# Patient Record
Sex: Female | Born: 1990
Health system: Southern US, Community
[De-identification: ages and names within clinical notes are randomized; demographics above are authoritative.]

## PROBLEM LIST (undated history)

## (undated) ENCOUNTER — Inpatient Hospital Stay: Payer: Self-pay

## (undated) DIAGNOSIS — O0930 Supervision of pregnancy with insufficient antenatal care, unspecified trimester: Secondary | ICD-10-CM

## (undated) DIAGNOSIS — O2340 Unspecified infection of urinary tract in pregnancy, unspecified trimester: Secondary | ICD-10-CM

## (undated) DIAGNOSIS — F988 Other specified behavioral and emotional disorders with onset usually occurring in childhood and adolescence: Secondary | ICD-10-CM

## (undated) DIAGNOSIS — Z6834 Body mass index (BMI) 34.0-34.9, adult: Secondary | ICD-10-CM

## (undated) HISTORY — DX: Other specified behavioral and emotional disorders with onset usually occurring in childhood and adolescence: F98.8

## (undated) HISTORY — DX: Unspecified infection of urinary tract in pregnancy, unspecified trimester: O23.40

## (undated) HISTORY — DX: Body mass index (BMI) 34.0-34.9, adult: Z68.34

## (undated) HISTORY — DX: Supervision of pregnancy with insufficient antenatal care, unspecified trimester: O09.30

## (undated) SURGERY — Surgical Case
Anesthesia: Epidural

---
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
## 2015-04-07 DIAGNOSIS — F900 Attention-deficit hyperactivity disorder, predominantly inattentive type: Secondary | ICD-10-CM | POA: Insufficient documentation

## 2015-04-07 DIAGNOSIS — F329 Major depressive disorder, single episode, unspecified: Secondary | ICD-10-CM | POA: Insufficient documentation

## 2015-04-07 DIAGNOSIS — F32A Depression, unspecified: Secondary | ICD-10-CM | POA: Insufficient documentation

## 2015-08-09 DIAGNOSIS — R875 Abnormal microbiological findings in specimens from female genital organs: Secondary | ICD-10-CM | POA: Diagnosis not present

## 2015-08-09 DIAGNOSIS — N920 Excessive and frequent menstruation with regular cycle: Secondary | ICD-10-CM | POA: Diagnosis not present

## 2015-08-09 DIAGNOSIS — Z1151 Encounter for screening for human papillomavirus (HPV): Secondary | ICD-10-CM | POA: Diagnosis not present

## 2015-08-09 DIAGNOSIS — Z309 Encounter for contraceptive management, unspecified: Secondary | ICD-10-CM | POA: Diagnosis not present

## 2015-08-09 DIAGNOSIS — Z113 Encounter for screening for infections with a predominantly sexual mode of transmission: Secondary | ICD-10-CM | POA: Diagnosis not present

## 2015-08-09 DIAGNOSIS — R87610 Atypical squamous cells of undetermined significance on cytologic smear of cervix (ASC-US): Secondary | ICD-10-CM | POA: Diagnosis not present

## 2015-08-09 LAB — HM PAP SMEAR

## 2015-10-05 DIAGNOSIS — Z3201 Encounter for pregnancy test, result positive: Secondary | ICD-10-CM | POA: Diagnosis not present

## 2015-10-05 DIAGNOSIS — Z3A01 Less than 8 weeks gestation of pregnancy: Secondary | ICD-10-CM | POA: Diagnosis not present

## 2015-10-05 DIAGNOSIS — O0991 Supervision of high risk pregnancy, unspecified, first trimester: Secondary | ICD-10-CM | POA: Diagnosis not present

## 2015-10-05 DIAGNOSIS — Z113 Encounter for screening for infections with a predominantly sexual mode of transmission: Secondary | ICD-10-CM | POA: Diagnosis not present

## 2015-10-05 DIAGNOSIS — N912 Amenorrhea, unspecified: Secondary | ICD-10-CM | POA: Diagnosis not present

## 2016-05-10 DIAGNOSIS — B9689 Other specified bacterial agents as the cause of diseases classified elsewhere: Secondary | ICD-10-CM | POA: Diagnosis not present

## 2016-05-10 DIAGNOSIS — N760 Acute vaginitis: Secondary | ICD-10-CM | POA: Diagnosis not present

## 2016-05-10 DIAGNOSIS — N3000 Acute cystitis without hematuria: Secondary | ICD-10-CM | POA: Diagnosis not present

## 2016-08-06 NOTE — L&D Delivery Note (Signed)
EFM D/C'ed for pt to ambulate in hallways as requested.

## 2016-08-06 NOTE — L&D Delivery Note (Signed)
Pt up to shower as okay with Dr. Jean RosenthalJackson

## 2016-08-06 NOTE — L&D Delivery Note (Signed)
Dr.Jackson present to evaluate pt and discuss plan of care with pt at this time pt agrees with plan of care.

## 2016-08-06 NOTE — L&D Delivery Note (Signed)
Dr. Jean RosenthalJackson notified of pt SVE new orders given at this time. May start IV of LR and Stadol 1 mg IV times 1 dose

## 2016-08-06 NOTE — L&D Delivery Note (Signed)
Dr. Jean RosenthalJackson notified of pt stating that her pain has increased and would like something for pain.  Order given for SVE.  Dr. Jean RosenthalJackson is on his way

## 2016-08-06 NOTE — L&D Delivery Note (Signed)
Pt in bed talking with visitors at bedside.  No needs voiced at this time.  Pt requested to eat regular food explained to pt about diet.  Ice chips given

## 2016-08-06 NOTE — L&D Delivery Note (Signed)
Dr. Jean RosenthalJackson notified of pt SVE and request for pain meds.  Dr. Jean RosenthalJackson stated that pt my have epidural at this time.  Will start bolus.

## 2016-08-06 NOTE — L&D Delivery Note (Signed)
EFM reapplied at this time. Will continue to monitor

## 2016-08-30 DIAGNOSIS — O2340 Unspecified infection of urinary tract in pregnancy, unspecified trimester: Secondary | ICD-10-CM

## 2016-08-30 HISTORY — DX: Unspecified infection of urinary tract in pregnancy, unspecified trimester: O23.40

## 2016-08-30 LAB — HM PAP SMEAR

## 2016-08-30 LAB — OB RESULTS CONSOLE GC/CHLAMYDIA
Chlamydia: NEGATIVE
GC PROBE AMP, GENITAL: NEGATIVE

## 2016-08-30 LAB — OB RESULTS CONSOLE VARICELLA ZOSTER ANTIBODY, IGG: Varicella: IMMUNE

## 2016-08-30 LAB — OB RESULTS CONSOLE HGB/HCT, BLOOD
HEMATOCRIT: 34 %
Hemoglobin: 11.3 g/dL

## 2016-08-30 LAB — OB RESULTS CONSOLE RPR: RPR: NONREACTIVE

## 2016-08-30 LAB — OB RESULTS CONSOLE RUBELLA ANTIBODY, IGM: Rubella: IMMUNE

## 2016-08-30 LAB — OB RESULTS CONSOLE ABO/RH: RH TYPE: POSITIVE

## 2016-08-30 LAB — OB RESULTS CONSOLE HEPATITIS B SURFACE ANTIGEN: HEP B S AG: NEGATIVE

## 2016-08-30 LAB — OB RESULTS CONSOLE PLATELET COUNT: PLATELETS: 178 10*3/uL

## 2016-08-30 LAB — OB RESULTS CONSOLE HIV ANTIBODY (ROUTINE TESTING): HIV: NONREACTIVE

## 2016-10-12 LAB — HPV APTIMA
HPV Aptima: NEGATIVE
HPV Aptima: NEGATIVE

## 2016-10-16 ENCOUNTER — Other Ambulatory Visit: Payer: Medicaid Other

## 2016-10-16 ENCOUNTER — Ambulatory Visit (INDEPENDENT_AMBULATORY_CARE_PROVIDER_SITE_OTHER): Payer: Medicaid Other | Admitting: Advanced Practice Midwife

## 2016-10-16 VITALS — BP 110/60 | Wt 189.0 lb

## 2016-10-16 DIAGNOSIS — Z6834 Body mass index (BMI) 34.0-34.9, adult: Secondary | ICD-10-CM

## 2016-10-16 DIAGNOSIS — O99210 Obesity complicating pregnancy, unspecified trimester: Secondary | ICD-10-CM

## 2016-10-16 DIAGNOSIS — Z3A23 23 weeks gestation of pregnancy: Secondary | ICD-10-CM

## 2016-10-16 DIAGNOSIS — Z113 Encounter for screening for infections with a predominantly sexual mode of transmission: Secondary | ICD-10-CM

## 2016-10-16 DIAGNOSIS — Z131 Encounter for screening for diabetes mellitus: Secondary | ICD-10-CM

## 2016-10-16 DIAGNOSIS — O99213 Obesity complicating pregnancy, third trimester: Secondary | ICD-10-CM | POA: Insufficient documentation

## 2016-10-16 DIAGNOSIS — Z369 Encounter for antenatal screening, unspecified: Secondary | ICD-10-CM

## 2016-10-16 NOTE — Progress Notes (Signed)
Doing well. Had not had early glucola yet so having it today. Will need 28 week labs next visit minus the glucola.

## 2016-10-16 NOTE — Progress Notes (Signed)
- 

## 2016-10-17 LAB — GLUCOSE, 1 HOUR GESTATIONAL: Gestational Diabetes Screen: 86 mg/dL (ref 65–139)

## 2016-11-13 ENCOUNTER — Ambulatory Visit (INDEPENDENT_AMBULATORY_CARE_PROVIDER_SITE_OTHER): Payer: Medicaid Other | Admitting: Obstetrics & Gynecology

## 2016-11-13 VITALS — BP 110/60 | Wt 193.0 lb

## 2016-11-13 DIAGNOSIS — Z6834 Body mass index (BMI) 34.0-34.9, adult: Secondary | ICD-10-CM

## 2016-11-13 DIAGNOSIS — O99213 Obesity complicating pregnancy, third trimester: Secondary | ICD-10-CM

## 2016-11-13 DIAGNOSIS — Z3490 Encounter for supervision of normal pregnancy, unspecified, unspecified trimester: Secondary | ICD-10-CM

## 2016-11-13 DIAGNOSIS — O0993 Supervision of high risk pregnancy, unspecified, third trimester: Secondary | ICD-10-CM | POA: Insufficient documentation

## 2016-11-13 DIAGNOSIS — O99210 Obesity complicating pregnancy, unspecified trimester: Secondary | ICD-10-CM

## 2016-11-13 DIAGNOSIS — Z3A27 27 weeks gestation of pregnancy: Secondary | ICD-10-CM

## 2016-11-13 NOTE — Progress Notes (Signed)
Breast feeding, IUD (Paraguard likely), Labs today, Glc 86 discussed, Low Back pain discussed (heat rest tylenol compression), PNV, FMC. Obesity RF discussed.

## 2016-11-13 NOTE — Patient Instructions (Signed)

## 2016-11-14 LAB — CBC
Hematocrit: 32.1 % — ABNORMAL LOW (ref 34.0–46.6)
Hemoglobin: 10.3 g/dL — ABNORMAL LOW (ref 11.1–15.9)
MCH: 27.4 pg (ref 26.6–33.0)
MCHC: 32.1 g/dL (ref 31.5–35.7)
MCV: 85 fL (ref 79–97)
Platelets: 181 10*3/uL (ref 150–379)
RBC: 3.76 x10E6/uL — AB (ref 3.77–5.28)
RDW: 13.8 % (ref 12.3–15.4)
WBC: 5.8 10*3/uL (ref 3.4–10.8)

## 2016-11-14 LAB — RPR: RPR Ser Ql: NONREACTIVE

## 2016-11-14 LAB — HIV ANTIBODY (ROUTINE TESTING W REFLEX): HIV SCREEN 4TH GENERATION: NONREACTIVE

## 2016-12-06 ENCOUNTER — Ambulatory Visit (INDEPENDENT_AMBULATORY_CARE_PROVIDER_SITE_OTHER): Payer: Medicaid Other | Admitting: Advanced Practice Midwife

## 2016-12-06 VITALS — BP 110/64 | Wt 195.0 lb

## 2016-12-06 DIAGNOSIS — Z3A30 30 weeks gestation of pregnancy: Secondary | ICD-10-CM

## 2016-12-06 NOTE — Progress Notes (Signed)
Back pain happens mostly at night when lying down- OK when upright. Reviewed comfort measures and handout given. No LOF, VB, Ctx's. Good fetal movement.

## 2016-12-06 NOTE — Patient Instructions (Signed)
Back Pain in Pregnancy Back pain during pregnancy is common. Back pain may be caused by several factors that are related to changes during your pregnancy. Follow these instructions at home: Managing pain, stiffness, and swelling   If directed, apply ice for sudden (acute) back pain.  Put ice in a plastic bag.  Place a towel between your skin and the bag.  Leave the ice on for 20 minutes, 2-3 times per day.  If directed, apply heat to the affected area before you exercise:  Place a towel between your skin and the heat pack or heating pad.  Leave the heat on for 20-30 minutes.  Remove the heat if your skin turns bright red. This is especially important if you are unable to feel pain, heat, or cold. You may have a greater risk of getting burned. Activity   Exercise as told by your health care provider. Exercising is the best way to prevent or manage back pain.  Listen to your body when lifting. If lifting hurts, ask for help or bend your knees. This uses your leg muscles instead of your back muscles.  Squat down when picking up something from the floor. Do not bend over.  Only use bed rest as told by your health care provider. Bed rest should only be used for the most severe episodes of back pain. Standing, Sitting, and Lying Down   Do not stand in one place for long periods of time.  Use good posture when sitting. Make sure your head rests over your shoulders and is not hanging forward. Use a pillow on your lower back if necessary.  Try sleeping on your side, preferably the left side, with a pillow or two between your legs. If you are sore after a night's rest, your bed may be too soft. A firm mattress may provide more support for your back during pregnancy. General instructions   Do not wear high heels.  Eat a healthy diet. Try to gain weight within your health care provider's recommendations.  Use a maternity girdle, elastic sling, or back brace as told by your health care  provider.  Take over-the-counter and prescription medicines only as told by your health care provider.  Keep all follow-up visits as told by your health care provider. This is important. This includes any visits with any specialists, such as a physical therapist. Contact a health care provider if:  Your back pain interferes with your daily activities.  You have increasing pain in other parts of your body. Get help right away if:  You develop numbness, tingling, weakness, or problems with the use of your arms or legs.  You develop severe back pain that is not controlled with medicine.  You have a sudden change in bowel or bladder control.  You develop shortness of breath, dizziness, or you faint.  You develop nausea, vomiting, or sweating.  You have back pain that is a rhythmic, cramping pain similar to labor pains. Labor pain is usually 1-2 minutes apart, lasts for about 1 minute, and involves a bearing down feeling or pressure in your pelvis.  You have back pain and your water breaks or you have vaginal bleeding.  You have back pain or numbness that travels down your leg.  Your back pain developed after you fell.  You develop pain on one side of your back.  You see blood in your urine.  You develop skin blisters in the area of your back pain. This information is not intended to replace   advice given to you by your health care provider. Make sure you discuss any questions you have with your health care provider. Document Released: 10/31/2005 Document Revised: 12/29/2015 Document Reviewed: 04/06/2015 Elsevier Interactive Patient Education  2017 [employer]  

## 2016-12-06 NOTE — Progress Notes (Signed)
Pt having some back pain.

## 2016-12-20 ENCOUNTER — Encounter: Payer: Self-pay | Admitting: Certified Nurse Midwife

## 2016-12-20 ENCOUNTER — Ambulatory Visit (INDEPENDENT_AMBULATORY_CARE_PROVIDER_SITE_OTHER): Payer: Medicaid Other | Admitting: Certified Nurse Midwife

## 2016-12-20 VITALS — BP 106/62 | Wt 198.0 lb

## 2016-12-20 DIAGNOSIS — Z3A32 32 weeks gestation of pregnancy: Secondary | ICD-10-CM

## 2016-12-20 DIAGNOSIS — Z3483 Encounter for supervision of other normal pregnancy, third trimester: Secondary | ICD-10-CM

## 2016-12-20 DIAGNOSIS — Z23 Encounter for immunization: Secondary | ICD-10-CM | POA: Diagnosis not present

## 2016-12-20 NOTE — Progress Notes (Signed)
Pt reports no problems. tdap and blood consent today.  

## 2016-12-30 NOTE — Progress Notes (Signed)
Doing well. +FM Given samples of Vitafol Gummies Breast feeding/ Paragard [facility] instructions ROB 2 weeks.

## 2017-01-03 ENCOUNTER — Ambulatory Visit (INDEPENDENT_AMBULATORY_CARE_PROVIDER_SITE_OTHER): Payer: Medicaid Other | Admitting: Obstetrics & Gynecology

## 2017-01-03 VITALS — BP 118/70 | Wt 200.0 lb

## 2017-01-03 DIAGNOSIS — Z3490 Encounter for supervision of normal pregnancy, unspecified, unspecified trimester: Secondary | ICD-10-CM

## 2017-01-03 DIAGNOSIS — Z3A34 34 weeks gestation of pregnancy: Secondary | ICD-10-CM

## 2017-01-03 DIAGNOSIS — O99213 Obesity complicating pregnancy, third trimester: Secondary | ICD-10-CM

## 2017-01-03 NOTE — Patient Instructions (Signed)
Third Trimester of Pregnancy The third trimester is from week 28 through week 40 (months 7 through 9). The third trimester is a time when the unborn baby (fetus) is growing rapidly. At the end of the ninth month, the fetus is about 20 inches in length and weighs 6-10 pounds. Body changes during your third trimester Your body will continue to go through many changes during pregnancy. The changes vary from woman to woman. During the third trimester:  Your weight will continue to increase. You can expect to gain 25-35 pounds (11-16 kg) by the end of the pregnancy.  You may begin to get stretch marks on your hips, abdomen, and breasts.  You may urinate more often because the fetus is moving lower into your pelvis and pressing on your bladder.  You may develop or continue to have heartburn. This is caused by increased hormones that slow down muscles in the digestive tract.  You may develop or continue to have constipation because increased hormones slow digestion and cause the muscles that push waste through your intestines to relax.  You may develop hemorrhoids. These are swollen veins (varicose veins) in the rectum that can itch or be painful.  You may develop swollen, bulging veins (varicose veins) in your legs.  You may have increased body aches in the pelvis, back, or thighs. This is due to weight gain and increased hormones that are relaxing your joints.  You may have changes in your hair. These can include thickening of your hair, rapid growth, and changes in texture. Some women also have hair loss during or after pregnancy, or hair that feels dry or thin. Your hair will most likely return to normal after your baby is born.  Your breasts will continue to grow and they will continue to become tender. A yellow fluid (colostrum) may leak from your breasts. This is the first milk you are producing for your baby.  Your belly button may stick out.  You may notice more swelling in your hands,  face, or ankles.  You may have increased tingling or numbness in your hands, arms, and legs. The skin on your belly may also feel numb.  You may feel short of breath because of your expanding uterus.  You may have more problems sleeping. This can be caused by the size of your belly, increased need to urinate, and an increase in your body's metabolism.  You may notice the fetus "dropping," or moving lower in your abdomen (lightening).  You may have increased vaginal discharge.  You may notice your joints feel loose and you may have pain around your pelvic bone.  What to expect at prenatal visits You will have prenatal exams every 2 weeks until week 36. Then you will have weekly prenatal exams. During a routine prenatal visit:  You will be weighed to make sure you and the baby are growing normally.  Your blood pressure will be taken.  Your abdomen will be measured to track your baby's growth.  The fetal heartbeat will be listened to.  Any test results from the previous visit will be discussed.  You may have a cervical check near your due date to see if your cervix has softened or thinned (effaced).  You will be tested for Group B streptococcus. This happens between 35 and 37 weeks.  Your health care provider may ask you:  What your birth plan is.  How you are feeling.  If you are feeling the baby move.  If you have had   any abnormal symptoms, such as leaking fluid, bleeding, severe headaches, or abdominal cramping.  If you are using any tobacco products, including cigarettes, chewing tobacco, and electronic cigarettes.  If you have any questions.  Other tests or screenings that may be performed during your third trimester include:  Blood tests that check for low iron levels (anemia).  Fetal testing to check the health, activity level, and growth of the fetus. Testing is done if you have certain medical conditions or if there are problems during the  pregnancy.  Nonstress test (NST). This test checks the health of your baby to make sure there are no signs of problems, such as the baby not getting enough oxygen. During this test, a belt is placed around your belly. The baby is made to move, and its heart rate is monitored during movement.  What is false labor? False labor is a condition in which you feel small, irregular tightenings of the muscles in the womb (contractions) that usually go away with rest, changing position, or drinking water. These are called Braxton Hicks contractions. Contractions may last for hours, days, or even weeks before true labor sets in. If contractions come at regular intervals, become more frequent, increase in intensity, or become painful, you should see your health care provider. What are the signs of labor?  Abdominal cramps.  Regular contractions that start at 10 minutes apart and become stronger and more frequent with time.  Contractions that start on the top of the uterus and spread down to the lower abdomen and back.  Increased pelvic pressure and dull back pain.  A watery or bloody mucus discharge that comes from the vagina.  Leaking of amniotic fluid. This is also known as your "water breaking." It could be a slow trickle or a gush. Let your health care provider know if it has a color or strange odor. If you have any of these signs, call your health care provider right away, even if it is before your due date. Follow these instructions at home: Medicines  Follow your health care provider's instructions regarding medicine use. Specific medicines may be either safe or unsafe to take during pregnancy.  Take a prenatal vitamin that contains at least 600 micrograms (mcg) of folic acid.  If you develop constipation, try taking a stool softener if your health care provider approves. Eating and drinking  Eat a balanced diet that includes fresh fruits and vegetables, whole grains, good sources of protein  such as meat, eggs, or tofu, and low-fat dairy. Your health care provider will help you determine the amount of weight gain that is right for you.  Avoid raw meat and uncooked cheese. These carry germs that can cause birth defects in the baby.  If you have low calcium intake from food, talk to your health care provider about whether you should take a daily calcium supplement.  Eat four or five small meals rather than three large meals a day.  Limit foods that are high in fat and processed sugars, such as fried and sweet foods.  To prevent constipation: ? Drink enough fluid to keep your urine clear or pale yellow. ? Eat foods that are high in fiber, such as fresh fruits and vegetables, whole grains, and beans. Activity  Exercise only as directed by your health care provider. Most women can continue their usual exercise routine during pregnancy. Try to exercise for 30 minutes at least 5 days a week. Stop exercising if you experience uterine contractions.  Avoid heavy   lifting.  Do not exercise in extreme heat or humidity, or at high altitudes.  Wear low-heel, comfortable shoes.  Practice good posture.  You may continue to have sex unless your health care provider tells you otherwise. Relieving pain and discomfort  Take frequent breaks and rest with your legs elevated if you have leg cramps or low back pain.  Take warm sitz baths to soothe any pain or discomfort caused by hemorrhoids. Use hemorrhoid cream if your health care provider approves.  Wear a good support bra to prevent discomfort from breast tenderness.  If you develop varicose veins: ? Wear support pantyhose or compression stockings as told by your healthcare provider. ? Elevate your feet for 15 minutes, 3-4 times a day. Prenatal care  Write down your questions. Take them to your prenatal visits.  Keep all your prenatal visits as told by your health care provider. This is important. Safety  Wear your seat belt at  all times when driving.  Make a list of emergency phone numbers, including numbers for family, friends, the hospital, and police and fire departments. General instructions  Avoid cat litter boxes and soil used by cats. These carry germs that can cause birth defects in the baby. If you have a cat, ask someone to clean the litter box for you.  Do not travel far distances unless it is absolutely necessary and only with the approval of your health care provider.  Do not use hot tubs, steam rooms, or saunas.  Do not drink alcohol.  Do not use any products that contain nicotine or tobacco, such as cigarettes and e-cigarettes. If you need help quitting, ask your health care provider.  Do not use any medicinal herbs or unprescribed drugs. These chemicals affect the formation and growth of the baby.  Do not douche or use tampons or scented sanitary pads.  Do not cross your legs for long periods of time.  To prepare for the arrival of your baby: ? Take prenatal classes to understand, practice, and ask questions about labor and delivery. ? Make a trial run to the hospital. ? Visit the hospital and tour the maternity area. ? Arrange for maternity or paternity leave through employers. ? Arrange for family and friends to take care of pets while you are in the hospital. ? Purchase a rear-facing car seat and make sure you know how to install it in your car. ? Pack your hospital bag. ? Prepare the baby's nursery. Make sure to remove all pillows and stuffed animals from the baby's crib to prevent suffocation.  Visit your dentist if you have not gone during your pregnancy. Use a soft toothbrush to brush your teeth and be gentle when you floss. Contact a health care provider if:  You are unsure if you are in labor or if your water has broken.  You become dizzy.  You have mild pelvic cramps, pelvic pressure, or nagging pain in your abdominal area.  You have lower back pain.  You have persistent  nausea, vomiting, or diarrhea.  You have an unusual or bad smelling vaginal discharge.  You have pain when you urinate. Get help right away if:  Your water breaks before 37 weeks.  You have regular contractions less than 5 minutes apart before 37 weeks.  You have a fever.  You are leaking fluid from your vagina.  You have spotting or bleeding from your vagina.  You have severe abdominal pain or cramping.  You have rapid weight loss or weight gain.    You have shortness of breath with chest pain.  You notice sudden or extreme swelling of your face, hands, ankles, feet, or legs.  Your baby makes fewer than 10 movements in 2 hours.  You have severe headaches that do not go away when you take medicine.  You have vision changes. Summary  The third trimester is from week 28 through week 40, months 7 through 9. The third trimester is a time when the unborn baby (fetus) is growing rapidly.  During the third trimester, your discomfort may increase as you and your baby continue to gain weight. You may have abdominal, leg, and back pain, sleeping problems, and an increased need to urinate.  During the third trimester your breasts will keep growing and they will continue to become tender. A yellow fluid (colostrum) may leak from your breasts. This is the first milk you are producing for your baby.  False labor is a condition in which you feel small, irregular tightenings of the muscles in the womb (contractions) that eventually go away. These are called Braxton Hicks contractions. Contractions may last for hours, days, or even weeks before true labor sets in.  Signs of labor can include: abdominal cramps; regular contractions that start at 10 minutes apart and become stronger and more frequent with time; watery or bloody mucus discharge that comes from the vagina; increased pelvic pressure and dull back pain; and leaking of amniotic fluid. This information is not intended to replace advice  given to you by your health care provider. Make sure you discuss any questions you have with your health care provider. Document Released: 07/17/2001 Document Revised: 12/29/2015 Document Reviewed: 09/23/2012 Elsevier Interactive Patient Education  2017 [employer]  

## 2017-01-03 NOTE — Progress Notes (Signed)
PNV, FMC, PTL precautions

## 2017-01-17 ENCOUNTER — Ambulatory Visit (INDEPENDENT_AMBULATORY_CARE_PROVIDER_SITE_OTHER): Payer: Medicaid Other | Admitting: Obstetrics and Gynecology

## 2017-01-17 VITALS — BP 124/72 | Wt 203.0 lb

## 2017-01-17 DIAGNOSIS — Z6834 Body mass index (BMI) 34.0-34.9, adult: Secondary | ICD-10-CM

## 2017-01-17 DIAGNOSIS — Z3A36 36 weeks gestation of pregnancy: Secondary | ICD-10-CM

## 2017-01-17 DIAGNOSIS — O99213 Obesity complicating pregnancy, third trimester: Secondary | ICD-10-CM

## 2017-01-17 DIAGNOSIS — F329 Major depressive disorder, single episode, unspecified: Secondary | ICD-10-CM

## 2017-01-17 DIAGNOSIS — Z113 Encounter for screening for infections with a predominantly sexual mode of transmission: Secondary | ICD-10-CM

## 2017-01-17 DIAGNOSIS — F32A Depression, unspecified: Secondary | ICD-10-CM

## 2017-01-17 DIAGNOSIS — O0993 Supervision of high risk pregnancy, unspecified, third trimester: Secondary | ICD-10-CM

## 2017-01-17 NOTE — Progress Notes (Signed)
No vb. No lof. GBS/Aptima today.  

## 2017-01-19 LAB — STREP GP B NAA: Strep Gp B NAA: NEGATIVE

## 2017-01-19 LAB — GC/CHLAMYDIA PROBE AMP
Chlamydia trachomatis, NAA: NEGATIVE
Neisseria gonorrhoeae by PCR: NEGATIVE

## 2017-01-25 ENCOUNTER — Ambulatory Visit (INDEPENDENT_AMBULATORY_CARE_PROVIDER_SITE_OTHER): Payer: Medicaid Other | Admitting: Obstetrics and Gynecology

## 2017-01-25 VITALS — BP 128/58 | Wt 206.0 lb

## 2017-01-25 DIAGNOSIS — Z3A37 37 weeks gestation of pregnancy: Secondary | ICD-10-CM

## 2017-01-25 DIAGNOSIS — O99213 Obesity complicating pregnancy, third trimester: Secondary | ICD-10-CM

## 2017-01-25 DIAGNOSIS — O0993 Supervision of high risk pregnancy, unspecified, third trimester: Secondary | ICD-10-CM

## 2017-01-25 NOTE — Progress Notes (Signed)
Worsening back pain (lumbago no CVA tenderness), lower extremity swelling.  Start out of work this week

## 2017-01-25 NOTE — Progress Notes (Signed)
Hands/feet swelling and hurt

## 2017-02-01 ENCOUNTER — Ambulatory Visit (INDEPENDENT_AMBULATORY_CARE_PROVIDER_SITE_OTHER): Payer: Medicaid Other | Admitting: Obstetrics and Gynecology

## 2017-02-01 VITALS — BP 124/70 | Wt 206.0 lb

## 2017-02-01 DIAGNOSIS — Z6834 Body mass index (BMI) 34.0-34.9, adult: Secondary | ICD-10-CM

## 2017-02-01 DIAGNOSIS — O0993 Supervision of high risk pregnancy, unspecified, third trimester: Secondary | ICD-10-CM

## 2017-02-01 DIAGNOSIS — F32A Depression, unspecified: Secondary | ICD-10-CM

## 2017-02-01 DIAGNOSIS — F329 Major depressive disorder, single episode, unspecified: Secondary | ICD-10-CM

## 2017-02-01 DIAGNOSIS — Z3A38 38 weeks gestation of pregnancy: Secondary | ICD-10-CM

## 2017-02-01 DIAGNOSIS — O99213 Obesity complicating pregnancy, third trimester: Secondary | ICD-10-CM

## 2017-02-01 NOTE — Progress Notes (Signed)
No vb. No lof. Cervical check today  °

## 2017-02-08 ENCOUNTER — Ambulatory Visit (INDEPENDENT_AMBULATORY_CARE_PROVIDER_SITE_OTHER): Payer: Medicaid Other | Admitting: Certified Nurse Midwife

## 2017-02-08 VITALS — BP 102/62 | Wt 206.0 lb

## 2017-02-08 DIAGNOSIS — R350 Frequency of micturition: Secondary | ICD-10-CM

## 2017-02-08 DIAGNOSIS — R8290 Unspecified abnormal findings in urine: Secondary | ICD-10-CM

## 2017-02-08 DIAGNOSIS — O0993 Supervision of high risk pregnancy, unspecified, third trimester: Secondary | ICD-10-CM | POA: Diagnosis not present

## 2017-02-08 DIAGNOSIS — Z3A38 38 weeks gestation of pregnancy: Secondary | ICD-10-CM | POA: Diagnosis not present

## 2017-02-08 LAB — POCT URINALYSIS DIPSTICK
Bilirubin, UA: NEGATIVE
Blood, UA: NEGATIVE
Glucose, UA: NEGATIVE
KETONES UA: NEGATIVE
NITRITE UA: POSITIVE
PH UA: 6 (ref 5.0–8.0)
Spec Grav, UA: >=1.03 — AB (ref 1.010–1.025)
UROBILINOGEN UA: NEGATIVE U/dL — AB

## 2017-02-08 MED ORDER — CEPHALEXIN 500 MG PO CAPS: 500.0000 mg | ORAL_CAPSULE | Freq: Three times a day (TID) | ORAL | 0 refills | Status: DC

## 2017-02-08 NOTE — Progress Notes (Signed)
Pt reports no problems. Desires cervical check.  

## 2017-02-08 NOTE — Addendum Note (Signed)
Addended by: Reather LittlerUTHUS, Normon Pettijohn D on: 02/08/2017 04:45 PM   Modules accepted: Orders

## 2017-02-08 NOTE — Progress Notes (Signed)
Wrong EDC / corrected EDC to reflect sure LMP Some contractions. No VB or LOF Some increased urinary frequency and bad odor to urine Urine dip positive for leuks, protein and nitrite Urine culture sent. Start on Keflex 500 mgm tid Cervix closed/ 25-50%/ -3 Labor precautions

## 2017-02-14 ENCOUNTER — Encounter: Payer: Self-pay | Admitting: *Deleted

## 2017-02-14 ENCOUNTER — Observation Stay
Admission: EM | Admit: 2017-02-14 | Discharge: 2017-02-14 | Disposition: A | Discharge status: Home / Self Care | Payer: Medicaid Other | Attending: Obstetrics & Gynecology | Admitting: Obstetrics & Gynecology

## 2017-02-14 DIAGNOSIS — R1030 Lower abdominal pain, unspecified: Secondary | ICD-10-CM | POA: Diagnosis not present

## 2017-02-14 DIAGNOSIS — Z3A39 39 weeks gestation of pregnancy: Secondary | ICD-10-CM | POA: Insufficient documentation

## 2017-02-14 DIAGNOSIS — O2343 Unspecified infection of urinary tract in pregnancy, third trimester: Secondary | ICD-10-CM | POA: Insufficient documentation

## 2017-02-14 DIAGNOSIS — O99213 Obesity complicating pregnancy, third trimester: Secondary | ICD-10-CM

## 2017-02-14 DIAGNOSIS — O26899 Other specified pregnancy related conditions, unspecified trimester: Secondary | ICD-10-CM

## 2017-02-14 DIAGNOSIS — O0993 Supervision of high risk pregnancy, unspecified, third trimester: Secondary | ICD-10-CM

## 2017-02-14 DIAGNOSIS — O26893 Other specified pregnancy related conditions, third trimester: Secondary | ICD-10-CM | POA: Diagnosis not present

## 2017-02-14 DIAGNOSIS — R1031 Right lower quadrant pain: Secondary | ICD-10-CM | POA: Diagnosis not present

## 2017-02-14 LAB — URINALYSIS, COMPLETE (UACMP) WITH MICROSCOPIC
BILIRUBIN URINE: NEGATIVE
GLUCOSE, UA: NEGATIVE mg/dL
HGB URINE DIPSTICK: NEGATIVE
KETONES UR: NEGATIVE mg/dL
NITRITE: POSITIVE — AB
PROTEIN: NEGATIVE mg/dL
Specific Gravity, Urine: 1.019 (ref 1.005–1.030)
pH: 5 (ref 5.0–8.0)

## 2017-02-14 LAB — URINE CULTURE

## 2017-02-14 MED ORDER — ACETAMINOPHEN 325 MG PO TABS: 650.0000 mg | ORAL_TABLET | ORAL | Status: DC | PRN

## 2017-02-14 MED ORDER — CEPHALEXIN 500 MG PO CAPS
500.0000 mg | ORAL_CAPSULE | Freq: Three times a day (TID) | ORAL | Status: DC
Start: 2017-02-14 — End: 2017-02-14
  Administered 2017-02-14: 500 mg via ORAL
  Filled 2017-02-14 (×3): qty 1

## 2017-02-14 MED ORDER — ONDANSETRON HCL 4 MG/2ML IJ SOLN: 4.0000 mg | Freq: Four times a day (QID) | INTRAMUSCULAR | Status: DC | PRN

## 2017-02-14 NOTE — Discharge Instructions (Signed)

## 2017-02-14 NOTE — Discharge Summary (Signed)
  See FPN 

## 2017-02-14 NOTE — Final Progress Note (Signed)
Physician Final Progress Note  Patient ID: Shelly Shah MRN: [id number] DOB/AGE: 1991-06-05 26 y.o.  Admit date: 02/14/2017 Admitting provider: Nadara Mustard, MD Discharge date: 02/14/2017  Admission Diagnoses: Suprapubic pain   Discharge Diagnoses:  Active Problems:   Pregnancy related bilateral lower abdominal pain, antepartum   UTI  Consults: None  Significant Findings/ Diagnostic Studies: 26 yo at 39 weeks with continuous suprapubic pain, no radiation to back; was dx with UTI Friday but has yet to start her Rx as it was prescribed (Keflex); no dysuria, n/v/d/f/c; no ROM or VB.  PMHx: She  has a past medical history of ADD (attention deficit disorder); BMI 34.0-34.9,adult; Insufficient prenatal care; and UTI (urinary tract infection) during pregnancy (08/30/2016). Also,  has no past surgical history on file., family history is not on file.,  reports that she has never smoked. She has never used smokeless tobacco. She reports that she does not drink alcohol or use drugs. Current Meds  Medication Sig  . Prenatal Vit-Fe Fumarate-FA (MULTIVITAMIN-PRENATAL) 27-0.8 MG TABS tablet Take 1 tablet by mouth daily at 12 noon.  . Also, has No Known Allergies..  Review of Systems  Constitutional: Negative for chills, fever and malaise/fatigue.  HENT: Negative for congestion, sinus pain and sore throat.   Eyes: Negative for blurred vision and pain.  Respiratory: Negative for cough and wheezing.   Cardiovascular: Negative for chest pain and leg swelling.  Gastrointestinal: Negative for abdominal pain, constipation, diarrhea, heartburn, nausea and vomiting.  Genitourinary: Negative for dysuria, frequency, hematuria and urgency.  Musculoskeletal: Negative for back pain, joint pain, myalgias and neck pain.  Skin: Negative for itching and rash.  Neurological: Negative for dizziness, tremors and weakness.  Endo/Heme/Allergies: Does not bruise/bleed easily.  Psychiatric/Behavioral: Negative  for depression. The patient is not nervous/anxious and does not have insomnia.   Physical examination Constitutional NAD, Conversant  Skin No rashes, lesions or ulceration. Normal palpated skin turgor. No nodularity.  Lungs: Clear to auscultation.No rales or wheezes. Normal Respiratory effort, no retractions.  Heart: NSR.  No murmurs or rubs appreciated. No periferal edema  Abdomen: Gravid.  Non-tender.  No masses.  No HSM. No hernia  Extremities: Moves all appropriately.  Normal ROM for age. No lymphadenopathy.  Neuro: Grossly intact  Psych: Oriented to PPT.  Normal mood. Normal affect.     Pelvic:   Vulva: Normal appearance.  No lesions.  Vagina: No lesions or abnormalities noted.  Urethra No masses tenderness or scarring.  Meatus Normal size without lesions or prolapse.  Cervix: 1/50/-4.   Perineum: Normal exam.  No lesions.        Bimanual   Uterus: Enlarged.  Non-tender.    Adnexae: Not palpated.  Cul-de-sac: Negative for abnormality.  FHT 140s Toco, rare ctx  Results for orders placed or performed during the hospital encounter of 02/14/17  Urinalysis, Complete w Microscopic  Result Value Ref Range   Color, Urine YELLOW (A) YELLOW   APPearance HAZY (A) CLEAR   Specific Gravity, Urine 1.019 1.005 - 1.030   pH 5.0 5.0 - 8.0   Glucose, UA NEGATIVE NEGATIVE mg/dL   Hgb urine dipstick NEGATIVE NEGATIVE   Bilirubin Urine NEGATIVE NEGATIVE   Ketones, ur NEGATIVE NEGATIVE mg/dL   Protein, ur NEGATIVE NEGATIVE mg/dL   Nitrite POSITIVE (A) NEGATIVE   Leukocytes, UA SMALL (A) NEGATIVE   RBC / HPF 0-5 0 - 5 RBC/hpf   WBC, UA TOO NUMEROUS TO COUNT 0 - 5 WBC/hpf   Bacteria, UA FEW (  A) NONE SEEN   Squamous Epithelial / LPF 0-5 (A) NONE SEEN   Mucous PRESENT    ASSESSMENT- UTI, no s/sx labor  Procedures: A NST procedure was performed with FHR monitoring and a normal baseline established, appropriate time of 20-40 minutes of evaluation, and accels >2 seen w 15x15 characteristics.   Results show a REACTIVE NST.   Discharge Condition: good  Disposition: Home, ABX, office visit follow up  Diet: Regular diet  Discharge Activity: Activity as tolerated  Discharge Instructions    Call MD for:  severe uncontrolled pain    Complete by:  As directed    Call MD for:  temperature >100.4    Complete by:  As directed      Allergies as of 02/14/2017   No Known Allergies     Medication List    TAKE these medications   cephALEXin 500 MG capsule Commonly known as:  KEFLEX Take 1 capsule (500 mg total) by mouth 3 (three) times daily.   multivitamin-prenatal 27-0.8 MG Tabs tablet Take 1 tablet by mouth daily at 12 noon.      Total time spent taking care of this patient: 30 minutes  Signed: Letitia Libraobert Paul Josue Falconi 02/14/2017, 2:20 PM

## 2017-02-14 NOTE — OB Triage Note (Signed)
Recvd to OBS1 with c/o contractions since Monday getting stronger and closer together 7/11 pm.  Changed to gown and to bed.  EFM applied.  Oriented to room and plan of care discussed.  Verbalized understanding and agrees with plan.

## 2017-02-15 ENCOUNTER — Inpatient Hospital Stay
Admission: EM | Admit: 2017-02-15 | Discharge: 2017-02-19 | DRG: 765 | Disposition: A | Discharge status: Home / Self Care | Payer: Medicaid Other | Attending: Obstetrics and Gynecology | Admitting: Obstetrics and Gynecology

## 2017-02-15 DIAGNOSIS — D62 Acute posthemorrhagic anemia: Secondary | ICD-10-CM | POA: Diagnosis not present

## 2017-02-15 DIAGNOSIS — O99214 Obesity complicating childbirth: Principal | ICD-10-CM | POA: Diagnosis present

## 2017-02-15 DIAGNOSIS — Z6834 Body mass index (BMI) 34.0-34.9, adult: Secondary | ICD-10-CM

## 2017-02-15 DIAGNOSIS — Z3A39 39 weeks gestation of pregnancy: Secondary | ICD-10-CM

## 2017-02-15 DIAGNOSIS — O9081 Anemia of the puerperium: Secondary | ICD-10-CM | POA: Diagnosis not present

## 2017-02-15 DIAGNOSIS — O0993 Supervision of high risk pregnancy, unspecified, third trimester: Secondary | ICD-10-CM

## 2017-02-15 DIAGNOSIS — O99213 Obesity complicating pregnancy, third trimester: Secondary | ICD-10-CM

## 2017-02-15 NOTE — OB Triage Note (Signed)
Pt c/o contractions that are consistently coming every 7-8 mins. She has had some other contractions throughout the week. States that she "lost my mucus plug last night". Contractions in the front of her abdomen.

## 2017-02-16 ENCOUNTER — Inpatient Hospital Stay: Payer: Medicaid Other | Admitting: Anesthesiology

## 2017-02-16 ENCOUNTER — Encounter
Admission: EM | Disposition: A | Discharge status: Home / Self Care | Payer: Self-pay | Source: Home / Self Care | Attending: Obstetrics and Gynecology

## 2017-02-16 DIAGNOSIS — O9081 Anemia of the puerperium: Secondary | ICD-10-CM | POA: Diagnosis not present

## 2017-02-16 DIAGNOSIS — O99214 Obesity complicating childbirth: Secondary | ICD-10-CM | POA: Diagnosis present

## 2017-02-16 DIAGNOSIS — D62 Acute posthemorrhagic anemia: Secondary | ICD-10-CM | POA: Diagnosis not present

## 2017-02-16 DIAGNOSIS — Z6834 Body mass index (BMI) 34.0-34.9, adult: Secondary | ICD-10-CM | POA: Diagnosis not present

## 2017-02-16 DIAGNOSIS — Z3493 Encounter for supervision of normal pregnancy, unspecified, third trimester: Secondary | ICD-10-CM | POA: Diagnosis present

## 2017-02-16 DIAGNOSIS — Z3A39 39 weeks gestation of pregnancy: Secondary | ICD-10-CM | POA: Diagnosis not present

## 2017-02-16 LAB — COMPREHENSIVE METABOLIC PANEL
ALT: 19 U/L (ref 14–54)
ANION GAP: 10 (ref 5–15)
AST: 31 U/L (ref 15–41)
Albumin: 3.1 g/dL — ABNORMAL LOW (ref 3.5–5.0)
Alkaline Phosphatase: 190 U/L — ABNORMAL HIGH (ref 38–126)
BUN: 10 mg/dL (ref 6–20)
CHLORIDE: 107 mmol/L (ref 101–111)
CO2: 20 mmol/L — AB (ref 22–32)
Calcium: 9.4 mg/dL (ref 8.9–10.3)
Creatinine, Ser: 0.73 mg/dL (ref 0.44–1.00)
GFR calc non Af Amer: >60 mL/min (ref >60)
Glucose, Bld: 92 mg/dL (ref 65–99)
Potassium: 3.7 mmol/L (ref 3.5–5.1)
SODIUM: 137 mmol/L (ref 135–145)
Total Bilirubin: 0.9 mg/dL (ref 0.3–1.2)
Total Protein: 6.8 g/dL (ref 6.5–8.1)

## 2017-02-16 LAB — CBC
HCT: 31.7 % — ABNORMAL LOW (ref 35.0–47.0)
Hemoglobin: 10.2 g/dL — ABNORMAL LOW (ref 12.0–16.0)
MCH: 25.3 pg — AB (ref 26.0–34.0)
MCHC: 32.2 g/dL (ref 32.0–36.0)
MCV: 78.6 fL — ABNORMAL LOW (ref 80.0–100.0)
Platelets: 188 10*3/uL (ref 150–440)
RBC: 4.03 MIL/uL (ref 3.80–5.20)
RDW: 15.7 % — AB (ref 11.5–14.5)
WBC: 10.7 10*3/uL (ref 3.6–11.0)

## 2017-02-16 LAB — TYPE AND SCREEN
ABO/RH(D): O POS
Antibody Screen: NEGATIVE

## 2017-02-16 LAB — PROTEIN / CREATININE RATIO, URINE
CREATININE, URINE: 261 mg/dL
Protein Creatinine Ratio: 0.1 mg/mg{Cre} (ref 0.00–0.15)
Total Protein, Urine: 26 mg/dL

## 2017-02-16 SURGERY — Surgical Case
Anesthesia: Epidural

## 2017-02-16 MED ORDER — OXYTOCIN 40 UNITS IN LACTATED RINGERS INFUSION - SIMPLE MED: 2.5000 [IU]/h | INTRAVENOUS | Status: AC

## 2017-02-16 MED ORDER — LIDOCAINE-EPINEPHRINE (PF) 1.5 %-1:200000 IJ SOLN
INTRAMUSCULAR | Status: DC | PRN
  Administered 2017-02-16: 3 mL via PERINEURAL

## 2017-02-16 MED ORDER — MORPHINE SULFATE (PF) 0.5 MG/ML IJ SOLN
INTRAMUSCULAR | Status: AC
  Filled 2017-02-16: qty 10

## 2017-02-16 MED ORDER — OXYTOCIN BOLUS FROM INFUSION: 500.0000 mL | Freq: Once | INTRAVENOUS | Status: DC

## 2017-02-16 MED ORDER — PHENYLEPHRINE 40 MCG/ML (10ML) SYRINGE FOR IV PUSH (FOR BLOOD PRESSURE SUPPORT): 80.0000 ug | PREFILLED_SYRINGE | INTRAVENOUS | Status: DC | PRN

## 2017-02-16 MED ORDER — OXYCODONE-ACETAMINOPHEN 5-325 MG PO TABS
1.0000 | ORAL_TABLET | ORAL | Status: DC | PRN
  Administered 2017-02-17 – 2017-02-19 (×7): 1 via ORAL
  Filled 2017-02-16 (×7): qty 1

## 2017-02-16 MED ORDER — FENTANYL 2.5 MCG/ML W/ROPIVACAINE 0.15% IN NS 100 ML EPIDURAL (ARMC)
12.0000 mL/h | EPIDURAL | Status: DC
  Administered 2017-02-16 (×2): 12 mL/h via EPIDURAL
  Filled 2017-02-16: qty 100

## 2017-02-16 MED ORDER — BUPIVACAINE 0.25 % ON-Q PUMP DUAL CATH 400 ML
400.0000 mL | INJECTION | Status: DC
  Filled 2017-02-16: qty 400

## 2017-02-16 MED ORDER — OXYCODONE-ACETAMINOPHEN 5-325 MG PO TABS: 2.0000 | ORAL_TABLET | ORAL | Status: DC | PRN

## 2017-02-16 MED ORDER — SENNOSIDES-DOCUSATE SODIUM 8.6-50 MG PO TABS
2.0000 | ORAL_TABLET | ORAL | Status: DC
  Administered 2017-02-16 – 2017-02-18 (×3): 2 via ORAL
  Filled 2017-02-16 (×3): qty 2

## 2017-02-16 MED ORDER — ONDANSETRON HCL 4 MG/2ML IJ SOLN
INTRAMUSCULAR | Status: DC | PRN
  Administered 2017-02-16: 4 mg via INTRAVENOUS

## 2017-02-16 MED ORDER — OXYTOCIN 40 UNITS IN LACTATED RINGERS INFUSION - SIMPLE MED: INTRAVENOUS | Status: DC | PRN

## 2017-02-16 MED ORDER — SOD CITRATE-CITRIC ACID 500-334 MG/5ML PO SOLN
30.0000 mL | ORAL | Status: DC | PRN
  Filled 2017-02-16: qty 15

## 2017-02-16 MED ORDER — SIMETHICONE 80 MG PO CHEW: 80.0000 mg | CHEWABLE_TABLET | ORAL | Status: DC | PRN

## 2017-02-16 MED ORDER — ACETAMINOPHEN 325 MG PO TABS: 650.0000 mg | ORAL_TABLET | ORAL | Status: DC | PRN

## 2017-02-16 MED ORDER — BUTORPHANOL TARTRATE 2 MG/ML IJ SOLN
1.0000 mg | Freq: Once | INTRAMUSCULAR | Status: AC
  Administered 2017-02-16: 1 mg via INTRAVENOUS
  Filled 2017-02-16: qty 1

## 2017-02-16 MED ORDER — LACTATED RINGERS IV SOLN
INTRAVENOUS | Status: DC
  Administered 2017-02-17: 04:00:00 via INTRAVENOUS

## 2017-02-16 MED ORDER — KETOROLAC TROMETHAMINE 30 MG/ML IJ SOLN
INTRAMUSCULAR | Status: AC
  Filled 2017-02-16: qty 1

## 2017-02-16 MED ORDER — ONDANSETRON HCL 4 MG/2ML IJ SOLN
INTRAMUSCULAR | Status: AC
  Filled 2017-02-16: qty 2

## 2017-02-16 MED ORDER — BUPIVACAINE HCL (PF) 0.5 % IJ SOLN
20.0000 mL | Freq: Once | INTRAMUSCULAR | Status: DC
  Filled 2017-02-16: qty 30

## 2017-02-16 MED ORDER — TERBUTALINE SULFATE 1 MG/ML IJ SOLN: 0.2500 mg | Freq: Once | INTRAMUSCULAR | Status: DC | PRN

## 2017-02-16 MED ORDER — EPHEDRINE 5 MG/ML INJ: 10.0000 mg | INTRAVENOUS | Status: DC | PRN

## 2017-02-16 MED ORDER — LACTATED RINGERS IV BOLUS (SEPSIS)
500.0000 mL | Freq: Once | INTRAVENOUS | Status: AC
  Administered 2017-02-16: 1000 mL via INTRAVENOUS

## 2017-02-16 MED ORDER — PRENATAL MULTIVITAMIN CH
1.0000 | ORAL_TABLET | Freq: Every day | ORAL | Status: DC
  Administered 2017-02-17 – 2017-02-19 (×3): 1 via ORAL
  Filled 2017-02-16 (×2): qty 1

## 2017-02-16 MED ORDER — COCONUT OIL OIL
1.0000 "application " | TOPICAL_OIL | Status: DC | PRN
  Administered 2017-02-18: 1 via TOPICAL
  Filled 2017-02-16: qty 120

## 2017-02-16 MED ORDER — LACTATED RINGERS IV SOLN: 500.0000 mL | INTRAVENOUS | Status: DC | PRN

## 2017-02-16 MED ORDER — MENTHOL 3 MG MT LOZG
1.0000 | LOZENGE | OROMUCOSAL | Status: DC | PRN
  Filled 2017-02-16: qty 9

## 2017-02-16 MED ORDER — OXYTOCIN 40 UNITS IN LACTATED RINGERS INFUSION - SIMPLE MED
1.0000 m[IU]/min | INTRAVENOUS | Status: DC
  Administered 2017-02-16: 2 m[IU]/min via INTRAVENOUS

## 2017-02-16 MED ORDER — SOD CITRATE-CITRIC ACID 500-334 MG/5ML PO SOLN
30.0000 mL | ORAL | Status: AC
  Administered 2017-02-16: 30 mL via ORAL

## 2017-02-16 MED ORDER — OXYTOCIN 40 UNITS IN LACTATED RINGERS INFUSION - SIMPLE MED
2.5000 [IU]/h | INTRAVENOUS | Status: DC
  Filled 2017-02-16 (×2): qty 1000

## 2017-02-16 MED ORDER — FENTANYL 2.5 MCG/ML W/ROPIVACAINE 0.15% IN NS 100 ML EPIDURAL (ARMC)
EPIDURAL | Status: DC | PRN
  Administered 2017-02-16: 12 mL/h via EPIDURAL

## 2017-02-16 MED ORDER — WITCH HAZEL-GLYCERIN EX PADS: 1.0000 "application " | MEDICATED_PAD | CUTANEOUS | Status: DC | PRN

## 2017-02-16 MED ORDER — SIMETHICONE 80 MG PO CHEW
80.0000 mg | CHEWABLE_TABLET | ORAL | Status: DC
  Administered 2017-02-16 – 2017-02-18 (×3): 80 mg via ORAL
  Filled 2017-02-16 (×3): qty 1

## 2017-02-16 MED ORDER — LIDOCAINE HCL (PF) 2 % IJ SOLN
INTRAMUSCULAR | Status: DC | PRN
  Administered 2017-02-16: 40 mg via INTRADERMAL
  Administered 2017-02-16: 200 mg via EPIDURAL
  Administered 2017-02-16: 40 mg via EPIDURAL

## 2017-02-16 MED ORDER — LACTATED RINGERS IV SOLN
INTRAVENOUS | Status: DC
  Administered 2017-02-16 (×4): via INTRAVENOUS

## 2017-02-16 MED ORDER — ONDANSETRON HCL 4 MG/2ML IJ SOLN
4.0000 mg | Freq: Four times a day (QID) | INTRAMUSCULAR | Status: DC | PRN
  Administered 2017-02-16: 4 mg via INTRAVENOUS
  Filled 2017-02-16: qty 2

## 2017-02-16 MED ORDER — DIPHENHYDRAMINE HCL 25 MG PO CAPS: 25.0000 mg | ORAL_CAPSULE | Freq: Four times a day (QID) | ORAL | Status: DC | PRN

## 2017-02-16 MED ORDER — IBUPROFEN 600 MG PO TABS
600.0000 mg | ORAL_TABLET | Freq: Four times a day (QID) | ORAL | Status: DC
  Administered 2017-02-17 – 2017-02-19 (×9): 600 mg via ORAL
  Filled 2017-02-16 (×9): qty 1

## 2017-02-16 MED ORDER — DIPHENHYDRAMINE HCL 50 MG/ML IJ SOLN: 12.5000 mg | INTRAMUSCULAR | Status: DC | PRN

## 2017-02-16 MED ORDER — SODIUM BICARBONATE 8.4 % IV SOLN
INTRAVENOUS | Status: AC
  Filled 2017-02-16: qty 50

## 2017-02-16 MED ORDER — LIDOCAINE HCL 2 % IJ SOLN
INTRAMUSCULAR | Status: AC
Start: 2017-02-16 — End: 2017-02-16
  Filled 2017-02-16: qty 20

## 2017-02-16 MED ORDER — MORPHINE SULFATE (PF) 0.5 MG/ML IJ SOLN
INTRAMUSCULAR | Status: DC | PRN
  Administered 2017-02-16: 3 mg via INTRAVENOUS
  Administered 2017-02-16: 2 mg via EPIDURAL

## 2017-02-16 MED ORDER — OXYTOCIN 10 UNIT/ML IJ SOLN: 10.0000 [IU] | Freq: Once | INTRAMUSCULAR | Status: DC

## 2017-02-16 MED ORDER — BUPIVACAINE HCL (PF) 0.5 % IJ SOLN
INTRAMUSCULAR | Status: DC | PRN
  Administered 2017-02-16: 10 mL

## 2017-02-16 MED ORDER — LACTATED RINGERS IV SOLN
500.0000 mL | Freq: Once | INTRAVENOUS | Status: AC
  Administered 2017-02-16: 500 mL via INTRAVENOUS

## 2017-02-16 MED ORDER — CEFAZOLIN SODIUM-DEXTROSE 2-4 GM/100ML-% IV SOLN
2.0000 g | INTRAVENOUS | Status: AC
Start: 2017-02-16 — End: 2017-02-16
  Administered 2017-02-16: 2 g via INTRAVENOUS
  Filled 2017-02-16: qty 100

## 2017-02-16 MED ORDER — LIDOCAINE HCL (PF) 1 % IJ SOLN
30.0000 mL | INTRAMUSCULAR | Status: AC | PRN
  Administered 2017-02-16: 1.2 mL via SUBCUTANEOUS

## 2017-02-16 MED ORDER — DIBUCAINE 1 % RE OINT: 1.0000 "application " | TOPICAL_OINTMENT | RECTAL | Status: DC | PRN

## 2017-02-16 MED ORDER — FENTANYL 2.5 MCG/ML W/ROPIVACAINE 0.15% IN NS 100 ML EPIDURAL (ARMC)
EPIDURAL | Status: AC
  Filled 2017-02-16: qty 100

## 2017-02-16 MED ORDER — KETOROLAC TROMETHAMINE 30 MG/ML IJ SOLN
INTRAMUSCULAR | Status: DC | PRN
  Administered 2017-02-16: 30 mg via INTRAVENOUS

## 2017-02-16 MED ORDER — OXYTOCIN 10 UNIT/ML IJ SOLN
INTRAMUSCULAR | Status: DC | PRN
Start: 2017-02-16 — End: 2017-02-16
  Administered 2017-02-16: 40 [IU]

## 2017-02-16 MED ORDER — SIMETHICONE 80 MG PO CHEW
80.0000 mg | CHEWABLE_TABLET | Freq: Three times a day (TID) | ORAL | Status: DC
  Administered 2017-02-17 – 2017-02-19 (×8): 80 mg via ORAL
  Filled 2017-02-16 (×8): qty 1

## 2017-02-16 MED ORDER — SODIUM CHLORIDE 0.9 % IJ SOLN
INTRAMUSCULAR | Status: AC
  Filled 2017-02-16: qty 10

## 2017-02-16 SURGICAL SUPPLY — 29 items
BAG COUNTER SPONGE EZ (MISCELLANEOUS) ×4 IMPLANT
CANISTER SUCT 3000ML PPV (MISCELLANEOUS) ×3 IMPLANT
CATH KIT ON-Q SILVERSOAK 5IN (CATHETERS) ×6 IMPLANT
CHLORAPREP W/TINT 26ML (MISCELLANEOUS) ×6 IMPLANT
CLOSURE WOUND 1/2 X4 (GAUZE/BANDAGES/DRESSINGS)
COUNTER SPONGE BAG EZ (MISCELLANEOUS) ×2
DERMABOND ADVANCED (GAUZE/BANDAGES/DRESSINGS) ×2
DERMABOND ADVANCED .7 DNX12 (GAUZE/BANDAGES/DRESSINGS) ×1 IMPLANT
DRSG OPSITE POSTOP 4X10 (GAUZE/BANDAGES/DRESSINGS) IMPLANT
DRSG TELFA 3X8 NADH (GAUZE/BANDAGES/DRESSINGS) ×3 IMPLANT
ELECT CAUTERY BLADE 6.4 (BLADE) ×3 IMPLANT
ELECT REM PT RETURN 9FT ADLT (ELECTROSURGICAL) ×3
ELECTRODE REM PT RTRN 9FT ADLT (ELECTROSURGICAL) ×1 IMPLANT
GAUZE SPONGE 4X4 12PLY STRL (GAUZE/BANDAGES/DRESSINGS) ×3 IMPLANT
GLOVE BIO SURGEON STRL SZ7 (GLOVE) ×15 IMPLANT
GLOVE INDICATOR 7.5 STRL GRN (GLOVE) ×3 IMPLANT
GOWN STRL REUS W/ TWL LRG LVL3 (GOWN DISPOSABLE) ×3 IMPLANT
GOWN STRL REUS W/TWL LRG LVL3 (GOWN DISPOSABLE) ×6
NS IRRIG 1000ML POUR BTL (IV SOLUTION) ×3 IMPLANT
PACK C SECTION AR (MISCELLANEOUS) ×3 IMPLANT
PAD OB MATERNITY 4.3X12.25 (PERSONAL CARE ITEMS) ×3 IMPLANT
PAD PREP 24X41 OB/GYN DISP (PERSONAL CARE ITEMS) ×3 IMPLANT
STAPLER INSORB 30 2030 C-SECTI (MISCELLANEOUS) ×3 IMPLANT
STRIP CLOSURE SKIN 1/2X4 (GAUZE/BANDAGES/DRESSINGS) IMPLANT
SUT MNCRL AB 4-0 PS2 18 (SUTURE) ×3 IMPLANT
SUT PDS AB 1 TP1 96 (SUTURE) ×6 IMPLANT
SUT VIC AB 0 CTX 36 (SUTURE) ×4
SUT VIC AB 0 CTX36XBRD ANBCTRL (SUTURE) ×2 IMPLANT
SUT VIC AB 2-0 CT1 36 (SUTURE) ×3 IMPLANT

## 2017-02-16 NOTE — Transfer of Care (Signed)
Immediate Anesthesia Transfer of Care Note  Patient: Shelly BergerKeyona L Shah  Procedure(s) Performed: Procedure(s): CESAREAN SECTION (N/A)  Patient Location: PACU  Anesthesia Type:Epidural  Level of Consciousness: awake, alert  and oriented  Airway & Oxygen Therapy: Patient Spontanous Breathing  Post-op Assessment: Report given to RN and Post -op Vital signs reviewed and stable  Post vital signs: Reviewed and stable  Last Vitals:  Vitals:   02/16/17 1640 02/16/17 1732  BP: 131/87 132/70  Pulse: (!) 111 (!) 122  Resp:    Temp:  37.3 C    Last Pain:  Vitals:   02/16/17 1732  TempSrc: Oral  PainSc:       Patients Stated Pain Goal: 0 (02/15/17 1732)  Complications: No apparent anesthesia complications

## 2017-02-16 NOTE — H&P (Signed)
OB History & Physical   History of Present Illness:  Chief Complaint: contractions  HPI:  Shelly Shah is a 26 y.o. 424P1021 female at 2742w5d dated by sure LMP.  Her pregnancy has been complicated by obesity with BMI 34.    She reports contractions.   She denies leakage of fluid.   She denies vaginal bleeding.   She reports fetal movement.    Denies Headache, visual disturbances, and RUQ pain  Maternal Medical History:   Past Medical History:  Diagnosis Date  . ADD (attention deficit disorder)   . BMI 34.0-34.9,adult   . Insufficient prenatal care    late entry to care (15wks 3/7)  . UTI (urinary tract infection) during pregnancy 08/30/2016   Past Surgical History: denies  Allergies: No Known Allergies  Prior to Admission medications   Medication Sig Start Date End Date Taking? Authorizing Provider  cephALEXin (KEFLEX) 500 MG capsule Take 1 capsule (500 mg total) by mouth 3 (three) times daily. 02/08/17  Yes Farrel ConnersGutierrez, Colleen, CNM  Prenatal Vit-Fe Fumarate-FA (MULTIVITAMIN-PRENATAL) 27-0.8 MG TABS tablet Take 1 tablet by mouth daily at 12 noon.   Yes [provider]    OB History  Gravida Para Term Preterm AB Living  4 1 1  0 2 1  SAB TAB Ectopic Multiple Live Births  0 0 0 0 1    # Outcome Date GA Lbr Len/2nd Weight Sex Delivery Anes PTL Lv  4 Current           3 Term 09/30/09 1237w0d  6 lb 11 oz (3.033 kg) M Vag-Spont   LIV  2 AB           1 AB               Prenatal care site: [facility]  Social History: She  reports that she has never smoked. She has never used smokeless tobacco. She reports that she does not drink alcohol or use drugs.  Family History: She denies a family of gynecologic cancers  Review of Systems:  Review of Systems  Constitutional: Negative.   HENT: Negative.   Eyes: Negative.   Respiratory: Negative.   Cardiovascular: Negative.   Gastrointestinal: Negative.   Genitourinary: Negative.   Musculoskeletal: Negative.    Skin: Negative.   Neurological: Negative.   Psychiatric/Behavioral: Negative.      Physical Exam:  BP 122/79   Pulse 90   Temp 98.5 F (36.9 C) (Oral)   Resp 16   Ht 5\' 1"  (1.549 m)   Wt 206 lb (93.4 kg)   LMP 05/14/2016   SpO2 99%   BMI 38.92 kg/m   Physical Exam  Constitutional: She is oriented to person, place, and time. She appears well-developed and well-nourished. No distress.  HENT:  Head: Normocephalic and atraumatic.  Eyes: Conjunctivae are normal.  Neck: Normal range of motion. Neck supple.  Cardiovascular: Normal rate, regular rhythm and normal heart sounds.  Exam reveals no gallop and no friction rub.   No murmur heard. Pulmonary/Chest: Effort normal and breath sounds normal. She has no wheezes.  Abdominal: Soft. She exhibits no distension. Hernia confirmed negative in the right inguinal area and confirmed negative in the left inguinal area.  Gravid, NT  Genitourinary: Pelvic exam was performed with patient supine. There is no rash, tenderness or lesion on the right labia. There is no rash, tenderness or lesion on the left labia.  Genitourinary Comments: cvx 3.5 cm initial, changed to 5 cm  Musculoskeletal: Normal  range of motion.  Lymphadenopathy:       Right: No inguinal adenopathy present.       Left: No inguinal adenopathy present.  Neurological: She is alert and oriented to person, place, and time.  Skin: Skin is warm and dry. No rash noted.  Psychiatric: She has a normal mood and affect. Her behavior is normal. Judgment normal.    Pertinent Results:  Prenatal Labs: Blood type/Rh O positive  Antibody screen negative  Rubella Immune  Varicella Immune    RPR NR  HBsAg negative  HIV negative  GC negative  Chlamydia negative  Genetic screening declined  1 hour GTT Passed early and 28 wk (86)  3 hour GTT n/a  GBS negative on 01/17/17   Baseline FHR: 145 beats/min   Variability: moderate   Accelerations: present   Decelerations:  absent Contractions: present frequency: 1-2 q 10 min Overall assessment: cat 1   Assessment:  Shelly Shah is a 56 y.o. G16P1021 female at [redacted]w[redacted]d with normal labor.   Plan:  1. Admit to Labor & Delivery  2. CBC, T&S, Clrs, IVF 3. GBS negative.   4. Fetwal well-being: reassuring 5. Expectant management   Thomasene Mohair, MD 02/16/2017 3:38 AM

## 2017-02-16 NOTE — Progress Notes (Signed)
Dr. Bonney AidStaebler notified of adequate MVU's and SVE remains 7cm with cervical swelling present on left side.  Aware of urinary output via catheter of 20ml.  Light yellow color.  Md will be in to assess.

## 2017-02-16 NOTE — Progress Notes (Signed)
Subjective:  Comfortable with epidural in place  Objective:   Vitals: Blood pressure 112/62, pulse (!) 111, temperature 98.2 F (36.8 C), temperature source Oral, resp. rate 20, height 5\' 1"  (1.549 m), weight 206 lb (93.4 kg), last menstrual period 05/14/2016, SpO2 98 %. General:  Abdomen: Cervical Exam:  Dilation: 7 Effacement (%): 90 Cervical Position: Middle Station: -2 Presentation: Vertex Exam by:: Shifa Brisbon MD AROM clear  FHT: 145, moderate, +accels, no decels Toco: q688min  Results for orders placed or performed during the hospital encounter of 02/15/17 (from the past 24 hour(s))  CBC     Status: Abnormal   Collection Time: 02/16/17  3:49 AM  Result Value Ref Range   WBC 10.7 3.6 - 11.0 K/uL   RBC 4.03 3.80 - 5.20 MIL/uL   Hemoglobin 10.2 (L) 12.0 - 16.0 g/dL   HCT 16.131.7 (L) 09.635.0 - 04.547.0 %   MCV 78.6 (L) 80.0 - 100.0 fL   MCH 25.3 (L) 26.0 - 34.0 pg   MCHC 32.2 32.0 - 36.0 g/dL   RDW 40.915.7 (H) 81.111.5 - 91.414.5 %   Platelets 188 150 - 440 K/uL  [facility] screen [facility]     Status: None   Collection Time: 02/16/17  3:49 AM  Result Value Ref Range   ABO/RH(D) O POS    Antibody Screen NEG    Sample Expiration 02/19/2017   Comprehensive metabolic panel     Status: Abnormal   Collection Time: 02/16/17  3:49 AM  Result Value Ref Range   Sodium 137 135 - 145 mmol/L   Potassium 3.7 3.5 - 5.1 mmol/L   Chloride 107 101 - 111 mmol/L   CO2 20 (L) 22 - 32 mmol/L   Glucose, Bld 92 65 - 99 mg/dL   BUN 10 6 - 20 mg/dL   Creatinine, Ser 7.820.73 0.44 - 1.00 mg/dL   Calcium 9.4 8.9 - 95.610.3 mg/dL   Total Protein 6.8 6.5 - 8.1 g/dL   Albumin 3.1 (L) 3.5 - 5.0 g/dL   AST 31 15 - 41 U/L   ALT 19 14 - 54 U/L   Alkaline Phosphatase 190 (H) 38 - 126 U/L   Total Bilirubin 0.9 0.3 - 1.2 mg/dL   GFR calc non Af Amer >60 >60 mL/min   GFR calc Af Amer >60 >60 mL/min   Anion gap 10 5 - 15  Protein / creatinine ratio, urine     Status: None   Collection Time: 02/16/17  6:25  AM  Result Value Ref Range   Creatinine, Urine 261 mg/dL   Total Protein, Urine 26 mg/dL   Protein Creatinine Ratio 0.10 0.00 - 0.15 mg/mg[Cre]    Assessment:   26 y.o. O1H0865G4P1021 4670w5d term labor  Plan:   1) Labor - AROM clear fluid monitor contractions pattern  2) Fetus - cat I tracing

## 2017-02-16 NOTE — Progress Notes (Signed)
Subjective:  Comfortable with epidural in place  Objective:   Vitals: Blood pressure 114/61, pulse 98, temperature 97.9 F (36.6 C), temperature source Oral, resp. rate 16, height 5\' 1"  (1.549 m), weight 206 lb (93.4 kg), last menstrual period 05/14/2016, SpO2 97 %. General:  Abdomen: Cervical Exam:  Dilation: 7 Effacement (%): 90 Cervical Position: Middle Station: -2 Presentation: Vertex Exam by:: Brytney Somes MD IUPC placed Relatively narrow suprapubic arch  FHT: 150, moderate,+accels, occasional early Toco: q3-294min  Results for orders placed or performed during the hospital encounter of 02/15/17 (from the past 24 hour(s))  CBC     Status: Abnormal   Collection Time: 02/16/17  3:49 AM  Result Value Ref Range   WBC 10.7 3.6 - 11.0 K/uL   RBC 4.03 3.80 - 5.20 MIL/uL   Hemoglobin 10.2 (L) 12.0 - 16.0 g/dL   HCT 16.131.7 (L) 09.635.0 - 04.547.0 %   MCV 78.6 (L) 80.0 - 100.0 fL   MCH 25.3 (L) 26.0 - 34.0 pg   MCHC 32.2 32.0 - 36.0 g/dL   RDW 40.915.7 (H) 81.111.5 - 91.414.5 %   Platelets 188 150 - 440 K/uL  Type and screen [facility]     Status: None   Collection Time: 02/16/17  3:49 AM  Result Value Ref Range   ABO/RH(D) O POS    Antibody Screen NEG    Sample Expiration 02/19/2017   Comprehensive metabolic panel     Status: Abnormal   Collection Time: 02/16/17  3:49 AM  Result Value Ref Range   Sodium 137 135 - 145 mmol/L   Potassium 3.7 3.5 - 5.1 mmol/L   Chloride 107 101 - 111 mmol/L   CO2 20 (L) 22 - 32 mmol/L   Glucose, Bld 92 65 - 99 mg/dL   BUN 10 6 - 20 mg/dL   Creatinine, Ser 7.820.73 0.44 - 1.00 mg/dL   Calcium 9.4 8.9 - 95.610.3 mg/dL   Total Protein 6.8 6.5 - 8.1 g/dL   Albumin 3.1 (L) 3.5 - 5.0 g/dL   AST 31 15 - 41 U/L   ALT 19 14 - 54 U/L   Alkaline Phosphatase 190 (H) 38 - 126 U/L   Total Bilirubin 0.9 0.3 - 1.2 mg/dL   GFR calc non Af Amer >60 >60 mL/min   GFR calc Af Amer >60 >60 mL/min   Anion gap 10 5 - 15  Protein / creatinine ratio, urine     Status:  None   Collection Time: 02/16/17  6:25 AM  Result Value Ref Range   Creatinine, Urine 261 mg/dL   Total Protein, Urine 26 mg/dL   Protein Creatinine Ratio 0.10 0.00 - 0.15 mg/mg[Cre]    Assessment:   26 y.o. O1H0865G4P1021 1030w5d   Plan:   1) Labor -IUPC placed, was started on pitocin given inadequate contraction pattern.  No further descent of station since AROM raising concern for possible CPD - recheck after 2-hrs of adequate MVU's  2) Fetus - cat I tracing

## 2017-02-16 NOTE — Op Note (Signed)
Preoperative Diagnosis: 1) 26 y.o. [id number] at [redacted]w[redacted]d 2) Active phase arrest at 7cm  Postoperative Diagnosis: 1) 26 y.o. A5W0981 at [redacted]w[redacted]d 2) Active phase arrest at 7cm  Operation Performed: Primary low transverse C-section via pfannenstiel skin incision  Indication: 26 year old [id number] presenting in term labor progressed to 7cm, failed to make any further cervical change despite augmentation with artifical rupture of membranes, pitocin, and adequate MVU's.  Pelvis previously tested to 6lbs 11oz.  Anesthesia: .Spinal  Primary Surgeon: Vena Austria, MD  Preoperative Antibiotics: 2g ancef  Estimated Blood Loss:  IV Fluids:  Urine Output::  Drains or Tubes: Foley to gravity drainage, ON-Q catheter system  Implants: none  Specimens Removed: none  Complications: none  Intraoperative Findings:  Normal tubes ovaries and uterus.  Delivery resulted in the birth of a liveborn female ([place]), APGAR (1 MIN): 9   APGAR (5 MINS): 9, weight 3830g, 8lbs 7oz  Patient Condition: stable  Procedure in Detail:  Patient was taken to the operating room were she was administered regional anesthesia.  She was positioned in the supine position, prepped and draped in the  Usual sterile fashion.  Prior to proceeding with the case a time out was performed and the level of anesthetic was checked and noted to be adequate.  Utilizing the scalpel a pfannenstiel skin incision was made 2cm above the pubic symphysis and carried down sharply to the the level of the rectus fascia.  The fascia was incised in the midline using the scalpel and then extended using mayo scissors.  The superior border of the rectus fascia was grasped with two Kocher clamps and the underlying rectus muscles were dissected of the fascia using blunt dissection.  The median raphae was incised using Mayo scissors.   The inferior border of the rectus fascia was dissected of the rectus muscles in a similar fashion.  The  midline was identified, the peritoneum was entered bluntly and expanded using manual tractions.  The uterus was noted to be in a none rotated position.  Next the bladder blade was placed retracting the bladder caudally.  A bladder flap was created.  The bladder reflection was grasped with a pickup, and Metzenbaum scissors were then used the undermine the bladder reflection.  The bladder flap was developed using digital dissection.  The bladder blade was replaced retracting the bladder caudally out of the operative field.  A low transverse incision was scored on the lower uterine segment.  The hysterotomy was entered bluntly using the operators finger.  The hysterotomy incision was extended using manual traction.  The operators hand was placed within the hysterotomy position noting the fetus to be within the OA position.  The vertex was grasped, flexed, brought to the incision, and delivered a traumatically using fundal pressure.  The remainder of the body delivered with ease.  The infant was suctioned, cord was clamped and cut before handing off to the awaiting neonatologist.  The placenta was delivered using manual extraction.  The uterus was exteriorized, wiped clean of clots and debris using two moist laps.  The hysterotomy was closed using a two layer closure of 0 Vicryl, with the first being a running locked, the second a vertical imbricating.  The uterus was returned to the abdomen.  The peritoneal gutters were wiped clean of clots and debris using two moist laps.  The hysterotomy incision was re-inspected noted to be hemostatic.  The superior border of the rectus fascia was grasped with a Kocher clamp.  The ON-Q trocars were then placed 4cm above the superior border of the incision and tunneled subfascially.  The introducers were removed and the catheters were threaded through the sleeves after which the sleeves were removed.  The fascia was closed using a looped #1 PDS in a running fashion taking 1cm by  1cm bites.  The subcutaneous tissue was irrigated using warm saline, hemostasis achieved using the bovie.  The subcutaneous dead space was less than 3cm and was not closed.  The skin was closed using insorb staples.  Sponge needle and instrument counts were corrects times two.  The patient tolerated the procedure well and was taken to the recovery room in stable condition.

## 2017-02-16 NOTE — Progress Notes (Signed)
Dr. Bonney AidStaebler notified of FHR tracing with variables and early decelerations.  Inadequate MVU's; currently at 130.  Aware of decreased urinary output despite IV bolus x2 and checking urinary catheter for patency.  Will continue to monitor over next couple of hours.  MD also aware that pt was diagnosed with UTI on 7/12 and only took one full day of antibiotic therapy.   MD will be in to assess SVE and discuss POC with patient.

## 2017-02-16 NOTE — Progress Notes (Signed)
Subjective:  Feeling some discomfort currently despite epidural with contractions in upper abdomen  Objective:   Vitals: Blood pressure 131/87, pulse (!) 111, temperature 99.1 F (37.3 C), temperature source Oral, resp. rate (!) 22, height 5\' 1"  (1.549 m), weight 206 lb (93.4 kg), last menstrual period 05/14/2016, SpO2 98 %. General: NAD Abdomen: gravid, non-tender Cervical Exam:  Dilation: 7 Effacement (%): 80 Cervical Position: Middle Station: -2 Presentation: Vertex Exam by:: Nbryan rn   FHT: 150, moderate, +accels, occasional variable Toco: q2-773min  Results for orders placed or performed during the hospital encounter of 02/15/17 (from the past 24 hour(s))  CBC     Status: Abnormal   Collection Time: 02/16/17  3:49 AM  Result Value Ref Range   WBC 10.7 3.6 - 11.0 K/uL   RBC 4.03 3.80 - 5.20 MIL/uL   Hemoglobin 10.2 (L) 12.0 - 16.0 g/dL   HCT 82.931.7 (L) 56.235.0 - 13.047.0 %   MCV 78.6 (L) 80.0 - 100.0 fL   MCH 25.3 (L) 26.0 - 34.0 pg   MCHC 32.2 32.0 - 36.0 g/dL   RDW 86.515.7 (H) 78.411.5 - 69.614.5 %   Platelets 188 150 - 440 K/uL  Type and screen [facility]     Status: None   Collection Time: 02/16/17  3:49 AM  Result Value Ref Range   ABO/RH(D) O POS    Antibody Screen NEG    Sample Expiration 02/19/2017   Comprehensive metabolic panel     Status: Abnormal   Collection Time: 02/16/17  3:49 AM  Result Value Ref Range   Sodium 137 135 - 145 mmol/L   Potassium 3.7 3.5 - 5.1 mmol/L   Chloride 107 101 - 111 mmol/L   CO2 20 (L) 22 - 32 mmol/L   Glucose, Bld 92 65 - 99 mg/dL   BUN 10 6 - 20 mg/dL   Creatinine, Ser 2.950.73 0.44 - 1.00 mg/dL   Calcium 9.4 8.9 - 28.410.3 mg/dL   Total Protein 6.8 6.5 - 8.1 g/dL   Albumin 3.1 (L) 3.5 - 5.0 g/dL   AST 31 15 - 41 U/L   ALT 19 14 - 54 U/L   Alkaline Phosphatase 190 (H) 38 - 126 U/L   Total Bilirubin 0.9 0.3 - 1.2 mg/dL   GFR calc non Af Amer >60 >60 mL/min   GFR calc Af Amer >60 >60 mL/min   Anion gap 10 5 - 15  Protein /  creatinine ratio, urine     Status: None   Collection Time: 02/16/17  6:25 AM  Result Value Ref Range   Creatinine, Urine 261 mg/dL   Total Protein, Urine 26 mg/dL   Protein Creatinine Ratio 0.10 0.00 - 0.15 mg/mg[Cre]    Assessment:   26 y.o. [id number] 4756w5d with term labor, failure to progress secondary to CPD  Plan:   1) Labor - no cervical change despite adquate MVU, AROM and pitocin augmentation.  Narrow pelvis suspect CPD. -The patient was counseled regarding risk and benefits to proceeding with Cesarean section to expedite delivery.  Risk of cesarean section were discussed including risk of bleeding and need for potential intraoperative or postoperative blood transfusion with a rate of approximately 5% quoted for all Cesarean sections, risk of injury to adjacent organs including but not limited to bowl and bladder, the need for additional surgical procedures to address such injuries, and the risk of infection.  The risk of continued attempts at vaginal delivery include but are note limited to worsening  fetal or maternal status.  After consideration of options the patient is amenable to proceed with primary cesarean section for delivery.   2) Fetus - category I tracing

## 2017-02-16 NOTE — Anesthesia Procedure Notes (Signed)
Epidural Patient location during procedure: OB Start time: 02/16/2017 8:38 AM End time: 02/16/2017 8:01 AM  Staffing Performed: anesthesiologist   Preanesthetic Checklist Completed: patient identified, site marked, surgical consent, pre-op evaluation, timeout performed, IV checked, risks and benefits discussed and monitors and equipment checked  Epidural Patient position: sitting Prep: Betadine Patient monitoring: heart rate, continuous pulse ox and blood pressure Approach: midline Location: L4-L5 Injection technique: LOR saline  Needle:  Needle type: Tuohy  Needle gauge: 18 G Needle length: 9 cm and 9 Needle insertion depth: 8 cm Catheter type: closed end flexible Catheter size: 20 Guage Catheter at skin depth: 11 cm Test dose: negative and 1.5% lidocaine with Epi 1:200 K  Assessment Events: blood not aspirated, injection not painful, no injection resistance, negative IV test and no paresthesia  Additional Notes   Patient tolerated the insertion well without complications.Reason for block:procedure for pain

## 2017-02-16 NOTE — Anesthesia Preprocedure Evaluation (Signed)
Anesthesia Evaluation  Patient identified by MRN, date of birth, ID band Patient awake    Reviewed: Allergy & Precautions, NPO status , Patient's Chart, lab work & pertinent test results  History of Anesthesia Complications Negative for: history of anesthetic complications  Airway Mallampati: II       Dental   Pulmonary neg pulmonary ROS,           Cardiovascular hypertension (mild elevations in pressure, r/o pre-eclampsia labs drawn, last platelets only 5 hrs ago wnl),      Neuro/Psych Depression negative neurological ROS     GI/Hepatic negative GI ROS, Neg liver ROS,   Endo/Other  negative endocrine ROSMorbid obesity  Renal/GU negative Renal ROS     Musculoskeletal   Abdominal   Peds  Hematology   Anesthesia Other Findings   Reproductive/Obstetrics                             Anesthesia Physical Anesthesia Plan  ASA: II  Anesthesia Plan: Epidural   Post-op Pain Management:    Induction:   PONV Risk Score and Plan:   Airway Management Planned:   Additional Equipment:   Intra-op Plan:   Post-operative Plan:   Informed Consent: I have reviewed the patients History and Physical, chart, labs and discussed the procedure including the risks, benefits and alternatives for the proposed anesthesia with the patient or authorized representative who has indicated his/her understanding and acceptance.     Plan Discussed with:   Anesthesia Plan Comments:         Anesthesia Quick Evaluation

## 2017-02-16 NOTE — Anesthesia Post-op Follow-up Note (Cosign Needed)
Anesthesia QCDR form completed.        

## 2017-02-16 NOTE — Discharge Summary (Signed)
OB Discharge Summary     Patient Name: Shelly BergerKeyona L Shah DOB: 04/03/1991 MRN: [id number]  Date of admission: 02/15/2017 Delivering MD: Shelly Shah   Date of discharge: 02/19/2017  Admitting diagnosis: contractions failure to progress Intrauterine pregnancy: 4253w5d     Secondary diagnosis:  Active Problems:   Postpartum care following cesarean delivery  Additional problems:  1) Term labor 2) Active phase arrest/failure to progress     Discharge diagnosis: Term Pregnancy Delivered                                                                                                Post partum procedures:none  Augmentation: AROM and Pitocin  Complications: None  Hospital course:  Onset of Labor With Unplanned C/S  26 y.o. yo [id number] at 153w5d was admitted in Active Labor on 02/15/2017. Patient had a labor course significant for active phase arrest at 7cm. Membrane Rupture Time/Date: 9:23 AM ,02/16/2017   The patient went for cesarean section due to Arrest of Dilation, and delivered a Viable infant,02/16/2017  Details of operation can be found in separate operative note. Patient had an uncomplicated postpartum course.  She is ambulating,tolerating a regular diet, passing flatus, and urinating well.  Patient is discharged home in stable condition 02/19/17.  Physical exam  Vitals:   02/18/17 2035 02/18/17 2310 02/19/17 0310 02/19/17 0832  BP: (!) 109/54   122/76  Pulse: (!) 102   96  Resp: 18   18  Temp: 97.7 F (36.5 C) 98.5 F (36.9 C) 98.1 F (36.7 C) 98.8 F (37.1 C)  TempSrc: Oral Oral Oral Oral  SpO2: 97%   96%  Weight:      Height:       General: alert, cooperative and no distress Lochia: appropriate Uterine Fundus: firm Incision: Healing well with no significant drainage, Dressing is clean, dry, and intact DVT Evaluation: No evidence of DVT seen on physical exam. Labs: Lab Results  Component Value Date   WBC 10.1 02/18/2017   HGB 7.4 (L) 02/18/2017   HCT 23.2  (L) 02/18/2017   MCV 79.3 (L) 02/18/2017   PLT 155 02/18/2017   CMP Latest Ref Rng & Units 02/16/2017  Glucose 65 - 99 mg/dL 92  BUN 6 - 20 mg/dL 10  Creatinine 2.950.44 - 2.841.00 mg/dL 1.320.73  Sodium 440135 - 102145 mmol/L 137  Potassium 3.5 - 5.1 mmol/L 3.7  Chloride 101 - 111 mmol/L 107  CO2 22 - 32 mmol/L 20(L)  Calcium 8.9 - 10.3 mg/dL 9.4  Total Protein 6.5 - 8.1 g/dL 6.8  Total Bilirubin 0.3 - 1.2 mg/dL 0.9  Alkaline Phos 38 - 126 U/L 190(H)  AST 15 - 41 U/L 31  ALT 14 - 54 U/L 19    Discharge instruction: per After Visit Summary and "Baby and Me Booklet".  After visit meds:  Allergies as of 02/19/2017   No Known Allergies     Medication List    STOP taking these medications   cephALEXin 500 MG capsule Commonly known as:  KEFLEX     TAKE these medications   multivitamin-prenatal 27-0.8  MG Tabs tablet Take 1 tablet by mouth daily at 12 noon.   oxyCODONE-acetaminophen 5-325 MG tablet Commonly known as:  PERCOCET/ROXICET Take 1 tablet by mouth every 4 (four) hours as needed (pain scale 4-7).       Diet: routine diet  Activity: Advance as tolerated. Pelvic rest for 6 weeks.   Outpatient follow up:1 week Follow up Appt:No future appointments. Follow up at 1 week with Dr Shelly Shah for incision check Follow up Visit:No Follow-up on file.  Postpartum contraception: IUD Paragard  Newborn Data: Live born female  Birth Weight: 8 lb 7.1 oz (3830 g) APGAR: 9, 9  Baby Feeding: Breast Disposition:home with mother   02/19/2017 Shelly Shah, CNM

## 2017-02-17 ENCOUNTER — Encounter: Payer: Self-pay | Admitting: *Deleted

## 2017-02-17 LAB — CBC
HCT: 23.7 % — ABNORMAL LOW (ref 35.0–47.0)
Hemoglobin: 7.6 g/dL — ABNORMAL LOW (ref 12.0–16.0)
MCH: 25.3 pg — AB (ref 26.0–34.0)
MCHC: 32.1 g/dL (ref 32.0–36.0)
MCV: 78.8 fL — AB (ref 80.0–100.0)
PLATELETS: 144 10*3/uL — AB (ref 150–440)
RBC: 3.01 MIL/uL — ABNORMAL LOW (ref 3.80–5.20)
RDW: 15.8 % — AB (ref 11.5–14.5)
WBC: 12.9 10*3/uL — ABNORMAL HIGH (ref 3.6–11.0)

## 2017-02-17 NOTE — Anesthesia Postprocedure Evaluation (Signed)
Anesthesia Post Note  Patient: Shelly BergerKeyona L Shah  Procedure(s) Performed: Procedure(s) (LRB): CESAREAN SECTION (N/A)  Patient location during evaluation: Mother Baby Anesthesia Type: Epidural Level of consciousness: awake and alert Pain management: pain level controlled Vital Signs Assessment: post-procedure vital signs reviewed and stable Respiratory status: spontaneous breathing, nonlabored ventilation and respiratory function stable Cardiovascular status: stable Postop Assessment: epidural receding Anesthetic complications: no     Last Vitals:  Vitals:   02/17/17 0310 02/17/17 0734  BP: 115/64 (!) 106/59  Pulse: (!) 112 (!) 101  Resp: 18 17  Temp: 36.8 C 36.8 C    Last Pain:  Vitals:   02/17/17 0734  TempSrc: Oral  PainSc:                  Jameson Tormey K

## 2017-02-17 NOTE — Progress Notes (Signed)
Subjective:  Doing well, feels sore.  Has not ambulated yet.  Moderate lochia.  No fevers, no chills.  Tolerating po without N/V  Objective:  Blood pressure (!) 106/59, pulse (!) 101, temperature 98.3 F (36.8 C), temperature source Oral, resp. rate 17, height 5' 1"  (1.549 m), weight 206 lb (93.4 kg), last menstrual period 05/14/2016, SpO2 95 %, unknown if currently breastfeeding.  General: NAD Pulmonary: no increased work of breathing Abdomen: non-distended, non-tender, fundus firm at level of umbilicus Incision: D/C/I pressure dressing Extremities: no edema, no erythema, no tenderness  Results for orders placed or performed during the hospital encounter of 02/15/17 (from the past 72 hour(s))  CBC     Status: Abnormal   Collection Time: 02/16/17  3:49 AM  Result Value Ref Range   WBC 10.7 3.6 - 11.0 K/uL   RBC 4.03 3.80 - 5.20 MIL/uL   Hemoglobin 10.2 (L) 12.0 - 16.0 g/dL   HCT 31.7 (L) 35.0 - 47.0 %   MCV 78.6 (L) 80.0 - 100.0 fL   MCH 25.3 (L) 26.0 - 34.0 pg   MCHC 32.2 32.0 - 36.0 g/dL   RDW 15.7 (H) 11.5 - 14.5 %   Platelets 188 150 - 440 K/uL  Type and screen [place]     Status: None   Collection Time: 02/16/17  3:49 AM  Result Value Ref Range   ABO/RH(D) O POS    Antibody Screen NEG    Sample Expiration 02/19/2017   Comprehensive metabolic panel     Status: Abnormal   Collection Time: 02/16/17  3:49 AM  Result Value Ref Range   Sodium 137 135 - 145 mmol/L   Potassium 3.7 3.5 - 5.1 mmol/L   Chloride 107 101 - 111 mmol/L   CO2 20 (L) 22 - 32 mmol/L   Glucose, Bld 92 65 - 99 mg/dL   BUN 10 6 - 20 mg/dL   Creatinine, Ser 0.73 0.44 - 1.00 mg/dL   Calcium 9.4 8.9 - 10.3 mg/dL   Total Protein 6.8 6.5 - 8.1 g/dL   Albumin 3.1 (L) 3.5 - 5.0 g/dL   AST 31 15 - 41 U/L   ALT 19 14 - 54 U/L   Alkaline Phosphatase 190 (H) 38 - 126 U/L   Total Bilirubin 0.9 0.3 - 1.2 mg/dL   GFR calc non Af Amer >60 >60 mL/min   GFR calc Af Amer >60 >60 mL/min   Comment: (NOTE) The eGFR has been calculated using the CKD EPI equation. This calculation has not been validated in all clinical situations. eGFR's persistently <60 mL/min signify possible Chronic Kidney Disease.    Anion gap 10 5 - 15  Protein / creatinine ratio, urine     Status: None   Collection Time: 02/16/17  6:25 AM  Result Value Ref Range   Creatinine, Urine 261 mg/dL   Total Protein, Urine 26 mg/dL    Comment: NO NORMAL RANGE ESTABLISHED FOR THIS TEST   Protein Creatinine Ratio 0.10 0.00 - 0.15 mg/mg[Cre]  CBC     Status: Abnormal   Collection Time: 02/17/17  4:42 AM  Result Value Ref Range   WBC 12.9 (H) 3.6 - 11.0 K/uL   RBC 3.01 (L) 3.80 - 5.20 MIL/uL   Hemoglobin 7.6 (L) 12.0 - 16.0 g/dL    Comment: RESULT REPEATED AND VERIFIED   HCT 23.7 (L) 35.0 - 47.0 %   MCV 78.8 (L) 80.0 - 100.0 fL   MCH 25.3 (L) 26.0 -  34.0 pg   MCHC 32.1 32.0 - 36.0 g/dL   RDW 15.8 (H) 11.5 - 14.5 %   Platelets 144 (L) 150 - 440 K/uL     Assessment:   26 y.o. [id number] postoperativeday # 3 1LTCS active phase arrest   Plan:  1) Acute blood loss anemia - slightly tachycardic but otherwise asymptomatic - recheck CBC in AM, continue IV fluids for additional 24-hrs give tachycardia - po ferrous sulfate  2) Blood Type --/--/O POS (07/14 0349) / Rubella Immune (01/25 0000) / Varicella Immune  3) TDAP status - received 12/20/16  4) Breast/ Paragard  5) Disposition - anticipate discharge POD3

## 2017-02-17 NOTE — Anesthesia Post-op Follow-up Note (Signed)
  Anesthesia Pain Follow-up Note  Patient: Shelly BergerKeyona L Gaudin  Day #: 1  Date of Follow-up: 02/17/2017 Time: 8:49 AM  Last Vitals:  Vitals:   02/17/17 0310 02/17/17 0734  BP: 115/64 (!) 106/59  Pulse: (!) 112 (!) 101  Resp: 18 17  Temp: 36.8 C 36.8 C    Level of Consciousness: alert  Pain: 0 /10   Side Effects:None  Catheter Site Exam:clean, dry     Plan: D/C from anesthesia care at surgeon's request  KEPHART,WILLIAM K

## 2017-02-18 ENCOUNTER — Encounter: Payer: Medicaid Other | Admitting: Advanced Practice Midwife

## 2017-02-18 LAB — CBC
HEMATOCRIT: 23.2 % — AB (ref 35.0–47.0)
Hemoglobin: 7.4 g/dL — ABNORMAL LOW (ref 12.0–16.0)
MCH: 25.4 pg — ABNORMAL LOW (ref 26.0–34.0)
MCHC: 32.1 g/dL (ref 32.0–36.0)
MCV: 79.3 fL — AB (ref 80.0–100.0)
PLATELETS: 155 10*3/uL (ref 150–440)
RBC: 2.93 MIL/uL — ABNORMAL LOW (ref 3.80–5.20)
RDW: 16.5 % — AB (ref 11.5–14.5)
WBC: 10.1 10*3/uL (ref 3.6–11.0)

## 2017-02-18 MED ORDER — FERROUS SULFATE 325 (65 FE) MG PO TABS
325.0000 mg | ORAL_TABLET | Freq: Two times a day (BID) | ORAL | Status: DC
  Administered 2017-02-18 – 2017-02-19 (×2): 325 mg via ORAL
  Filled 2017-02-18 (×2): qty 1

## 2017-02-18 MED ORDER — DOCUSATE SODIUM 100 MG PO CAPS
100.0000 mg | ORAL_CAPSULE | Freq: Every day | ORAL | Status: DC
  Administered 2017-02-18 – 2017-02-19 (×2): 100 mg via ORAL
  Filled 2017-02-18 (×2): qty 1

## 2017-02-18 MED ORDER — VITAMIN C 500 MG PO TABS
500.0000 mg | ORAL_TABLET | Freq: Two times a day (BID) | ORAL | Status: DC
  Administered 2017-02-18 – 2017-02-19 (×3): 500 mg via ORAL
  Filled 2017-02-18 (×3): qty 1

## 2017-02-18 NOTE — Progress Notes (Signed)
POD #2 s/p Primary Cesarean section for arrest of dilation Subjective:  Gets a little lightheaded if she moves or turns too fast. Has not been out of room walking yet. Voiding without difficulty and tolerating a regular diet. Passing flatus. Some lower abdominal pain and cramping relieved with po pain mendication. Baby Landon breast feeding well.   Objective:  Blood pressure 115/66, pulse (!) 112, temperature 98.1 F (36.7 C), temperature source Oral, resp. rate 16, height 5' 1"  (1.549 m), weight 93.4 kg (206 lb), last menstrual period 05/14/2016, SpO2 97 %, unknown if currently breastfeeding.  General: NAD Heart: mild tachycardia without murmur Pulmonary: no increased work of breathing/ CTAB Abdomen: non-distended, non-tender, bowel sounds present Incision: C+D+I, ON Q intact Extremities: +1 to +2 edema, no erythema, no tenderness  Results for orders placed or performed during the hospital encounter of 02/15/17 (from the past 72 hour(s))  CBC     Status: Abnormal   Collection Time: 02/16/17  3:49 AM  Result Value Ref Range   WBC 10.7 3.6 - 11.0 K/uL   RBC 4.03 3.80 - 5.20 MIL/uL   Hemoglobin 10.2 (L) 12.0 - 16.0 g/dL   HCT 31.7 (L) 35.0 - 47.0 %   MCV 78.6 (L) 80.0 - 100.0 fL   MCH 25.3 (L) 26.0 - 34.0 pg   MCHC 32.2 32.0 - 36.0 g/dL   RDW 15.7 (H) 11.5 - 14.5 %   Platelets 188 150 - 440 K/uL  Type and screen [place]     Status: None   Collection Time: 02/16/17  3:49 AM  Result Value Ref Range   ABO/RH(D) O POS    Antibody Screen NEG    Sample Expiration 02/19/2017   Comprehensive metabolic panel     Status: Abnormal   Collection Time: 02/16/17  3:49 AM  Result Value Ref Range   Sodium 137 135 - 145 mmol/L   Potassium 3.7 3.5 - 5.1 mmol/L   Chloride 107 101 - 111 mmol/L   CO2 20 (L) 22 - 32 mmol/L   Glucose, Bld 92 65 - 99 mg/dL   BUN 10 6 - 20 mg/dL   Creatinine, Ser 0.73 0.44 - 1.00 mg/dL   Calcium 9.4 8.9 - 10.3 mg/dL   Total Protein 6.8 6.5  - 8.1 g/dL   Albumin 3.1 (L) 3.5 - 5.0 g/dL   AST 31 15 - 41 U/L   ALT 19 14 - 54 U/L   Alkaline Phosphatase 190 (H) 38 - 126 U/L   Total Bilirubin 0.9 0.3 - 1.2 mg/dL   GFR calc non Af Amer >60 >60 mL/min   GFR calc Af Amer >60 >60 mL/min    Comment: (NOTE) The eGFR has been calculated using the CKD EPI equation. This calculation has not been validated in all clinical situations. eGFR's persistently <60 mL/min signify possible Chronic Kidney Disease.    Anion gap 10 5 - 15  Protein / creatinine ratio, urine     Status: None   Collection Time: 02/16/17  6:25 AM  Result Value Ref Range   Creatinine, Urine 261 mg/dL   Total Protein, Urine 26 mg/dL    Comment: NO NORMAL RANGE ESTABLISHED FOR THIS TEST   Protein Creatinine Ratio 0.10 0.00 - 0.15 mg/mg[Cre]  CBC     Status: Abnormal   Collection Time: 02/17/17  4:42 AM  Result Value Ref Range   WBC 12.9 (H) 3.6 - 11.0 K/uL   RBC 3.01 (L) 3.80 - 5.20 MIL/uL  Hemoglobin 7.6 (L) 12.0 - 16.0 g/dL    Comment: RESULT REPEATED AND VERIFIED   HCT 23.7 (L) 35.0 - 47.0 %   MCV 78.8 (L) 80.0 - 100.0 fL   MCH 25.3 (L) 26.0 - 34.0 pg   MCHC 32.1 32.0 - 36.0 g/dL   RDW 15.8 (H) 11.5 - 14.5 %   Platelets 144 (L) 150 - 440 K/uL  CBC     Status: Abnormal   Collection Time: 02/18/17  4:09 AM  Result Value Ref Range   WBC 10.1 3.6 - 11.0 K/uL   RBC 2.93 (L) 3.80 - 5.20 MIL/uL   Hemoglobin 7.4 (L) 12.0 - 16.0 g/dL   HCT 23.2 (L) 35.0 - 47.0 %   MCV 79.3 (L) 80.0 - 100.0 fL   MCH 25.4 (L) 26.0 - 34.0 pg   MCHC 32.1 32.0 - 36.0 g/dL   RDW 16.5 (H) 11.5 - 14.5 %   Platelets 155 150 - 440 K/uL     Assessment:   26 y.o. [id number] postoperativeday # 2    Plan:  1) Acute blood loss anemia -little change in hemoglobin over night. Mildly symptomatic of anemia with some tachycardia. Starting iron supplements today. Patient to ambulate outside of room today with assist. If lightheadedness worsens, will offer blood transfusion. 2) O POS/ RI/  VI  3) TDAP given 12/20/16   4) Breast/Paraguard  5) Disposition-possible discharge tomorrow  Dalia Heading, CNM

## 2017-02-19 LAB — RPR: RPR: NONREACTIVE

## 2017-02-19 MED ORDER — OXYCODONE-ACETAMINOPHEN 5-325 MG PO TABS: 1.0000 | ORAL_TABLET | ORAL | 0 refills | Status: DC | PRN

## 2017-02-19 NOTE — Lactation Note (Signed)
This note was copied from a baby's chart. Lactation Consultation Note  Patient Name: Shelly Shah NFAOZ'HToday's Date: 02/19/2017 Reason for consult: Follow-up assessment Mom c/o difficult latch and blood in nipple shield. Mom had baby in awkward position and nipple was not up deeply into nipple shield. We corrected these issues with rationale for changes made. Mom's areola is quite edematous which likely is causing challenge to apply NS correctly and difficult latch. I reminded her of hand expression and had her practice. White milk is now freely leaking out with any gentle pressure on areola. Slight crack in nipple, but no blood seen now. Mom was able to nurse baby well on left side for over 15 minutes and then she correctly positioned and latched him on to right side and fed him until he fell asleep and was quite limp after gulping so much milk. I gave Mom breast shells to help with the swollen areola and flat nipples and suggested pre feed pumping if ever needed to evert the nipple more. She has personal use pump at home. Mom states this feeding felt "better".   Maternal Data    Feeding Feeding Type: Breast Fed Length of feed: 40 min  LATCH Score/Interventions Latch: Grasps breast easily, tongue down, lips flanged, rhythmical sucking. (only because of N.S.) Intervention(s): Adjust position;Assist with latch;Breast massage  Audible Swallowing: Spontaneous and intermittent  Type of Nipple: Flat Intervention(s):  (edema of areola)  Comfort (Breast/Nipple): Soft / non-tender     Hold (Positioning): Assistance needed to correctly position infant at breast and maintain latch. Intervention(s): Breastfeeding basics reviewed;Support Pillows;Position options;Skin to skin  LATCH Score: 8  Lactation Tools Discussed/Used Tools: [name] Nipple shield size: 24   Consult Status Consult Status: PRN    [name] 02/19/2017, 12:30 PM

## 2017-02-19 NOTE — Progress Notes (Signed)
Both parents viewed the DVD, "The period of Purple Cry" and understand all.  Copy given to parents for discharge home.

## 2017-02-19 NOTE — Progress Notes (Signed)
Discharged to home with NB.  To car via [facility], [place].

## 2017-02-19 NOTE — Progress Notes (Signed)
Discharge inst reviewed with pt.  Rx given for home use.  Inst on how to remove ON Q pump.  Verb u/o

## 2017-03-01 ENCOUNTER — Ambulatory Visit (INDEPENDENT_AMBULATORY_CARE_PROVIDER_SITE_OTHER): Payer: Medicaid Other | Admitting: Obstetrics and Gynecology

## 2017-03-01 ENCOUNTER — Encounter: Payer: Self-pay | Admitting: Obstetrics and Gynecology

## 2017-03-01 ENCOUNTER — Telehealth: Payer: Self-pay | Admitting: Obstetrics and Gynecology

## 2017-03-01 NOTE — Telephone Encounter (Signed)
Patient scheduled 8/24 for paragard insert

## 2017-03-01 NOTE — Progress Notes (Signed)
      Postoperative Follow-up Patient presents post op from primary cesarean section 2weeks ago for failure to progress.  Subjective: Patient reports marked improvement in her preop symptoms. Eating a regular diet without difficulty. Pain is controlled without any medications.  Activity: normal activities of daily living.  Objective: Vitals:   03/01/17 1057  BP: 102/68  Pulse: 97   General: NAD Abdomen: soft, non-tender, non-distended, incision healing well Ext: no edema  Assessment: [age] y.o. s/p LTCS stable  Plan: Patient has done well after surgery with no apparent complications.  I have discussed the post-operative course to date, and the expected progress moving forward.  The patient understands what complications to be concerned about.  I will see the patient in routine follow up, or sooner if needed.    Activity plan: No heavy lifting.  Wants paragard for postpartum contraception  Shelly Shah 03/03/2017, 9:09 AM

## 2017-03-04 NOTE — Telephone Encounter (Signed)
Noted. Will order to arrive by apt date/time. 

## 2017-03-28 NOTE — Progress Notes (Signed)
Postpartum Visit  Chief Complaint:  Chief Complaint  Patient presents with  . Postpartum Care    BV odor  . Contraception    Paragard insertion    History of Present Illness: Patient is a 26 y.o. Shelly Shah presents for postpartum visit.   Review the Delivery Report for details.  Date of delivery:  02/16/2017 This patient has no babies on file. Cesarean Section: Active phase arrest Pregnancy or labor problems:  no Any problems since the delivery:  no  Newborn Details:  SINGLETON :  1. Birth weight: 3830g 8lbs 7oz Maternal Details:  Breast Feeding:  yes Post partum depression/anxiety noted:  no Edinburgh Post-Partum Depression Score:  6  Date of last PAP: 08/30/16  normal   Review of Systems: Review of Systems  Constitutional: Negative for chills and fever.  HENT: Negative for congestion.   Respiratory: Negative for cough and shortness of breath.   Cardiovascular: Negative for chest pain and palpitations.  Gastrointestinal: Negative for abdominal pain, constipation, diarrhea, heartburn, nausea and vomiting.  Genitourinary: Negative for dysuria, frequency and urgency.  Skin: Negative for itching and rash.  Neurological: Negative for dizziness and headaches.  Endo/Heme/Allergies: Negative for polydipsia.  Psychiatric/Behavioral: Negative for depression.    The following portions of the patient's history were reviewed and updated as appropriate: allergies, current medications, past family history, past medical history, past social history, past surgical history and problem list.  Past Medical History:  Past Medical History:  Diagnosis Date  . ADD (attention deficit disorder)   . BMI 34.0-34.9,adult   . Insufficient prenatal care    late entry to care (15wks 3/7)  . UTI (urinary tract infection) during pregnancy 08/30/2016    Past Surgical History:  Past Surgical History:  Procedure Laterality Date  . CESAREAN SECTION N/A 02/16/2017   Procedure: CESAREAN  SECTION;  Surgeon: Vena Austria, MD;  Location: [facility] ORS;  Service: Obstetrics;  Laterality: N/A;    Family History:  No family history on file.  Social History:  Social History   Social History  . Marital status: Single    Spouse name: N/A  . Number of children: N/A  . Years of education: N/A   Occupational History  . Not on file.   Social History Main Topics  . Smoking status: Never Smoker  . Smokeless tobacco: Never Used  . Alcohol use No  . Drug use: No  . Sexual activity: Yes   Other Topics Concern  . Not on file   Social History Narrative  . No narrative on file    Allergies:  No Known Allergies  Medications: Prior to Admission medications   Medication Sig Start Date End Date Taking? Authorizing Provider  oxyCODONE-acetaminophen (PERCOCET/ROXICET) 5-325 MG tablet Take 1 tablet by mouth every 4 (four) hours as needed (pain scale 4-7). 02/19/17   Tresea Mall, CNM  Prenatal Vit-Fe Fumarate-FA (MULTIVITAMIN-PRENATAL) 27-0.8 MG TABS tablet Take 1 tablet by mouth daily at 12 noon.    [provider]    Physical Exam Vitals:  Vitals:   03/29/17 1121  BP: 102/70  Pulse: 73    General: NAD HEENT: normocephalic, anicteric Pulmonary: No increased work of breathing Abdomen: NABS, soft, non-tender, non-distended.  Umbilicus without lesions.  No hepatomegaly, splenomegaly or masses palpable. No evidence of hernia. Incision D/C/I Genitourinary:  External: Normal external female genitalia.  Normal urethral meatus, normal Bartholin's and Skene's glands.    Vagina: Normal vaginal mucosa, no evidence of prolapse.    Cervix: Grossly  normal in appearance, no bleeding  Uterus: Non-enlarged, mobile, normal contour.  No CMT  Adnexa: ovaries non-enlarged, no adnexal masses  Rectal: deferred Extremities: no edema, erythema, or tenderness Neurologic: Grossly intact Psychiatric: mood appropriate, affect full  GYNECOLOGY OFFICE PROCEDURE NOTE  [name] is a 26 y.o. [id number] here for Parguard  IUD insertion. No GYN concerns.   The patient is currently using breast feeding for contraception and her LMP is No LMP recorded..  The indication for her IUD is contraception/cycle control.  IUD Insertion Procedure Note Patient identified, informed consent performed, consent signed.   Discussed risks of irregular bleeding, cramping, infection, malpositioning, expulsion or uterine perforation of the IUD (1:1000 placements)  which may require further procedure such as laparoscopy.  IUD while effective at preventing pregnancy do not prevent transmission of sexually transmitted diseases and use of barrier methods for this purpose was discussed. Time out was performed.  Urine pregnancy test negative.  Speculum placed in the vagina.  Cervix visualized.  Cleaned with Betadine x 2.  Grasped anteriorly with a single tooth tenaculum.  Uterus sounded to 8.5cm cm. IUD placed per manufacturer's recommendations.  Strings trimmed to 3 cm. Tenaculum was removed, good hemostasis noted.  Patient tolerated procedure well.   Patient was given post-procedure instructions.  She was advised to have backup contraception for one week.  Patient was also asked to check IUD strings periodically and follow up in 6 weeks for IUD check.  IUD insertion CPT 58300,  Paraguard [id number]  Assessment: 26 y.o. [id number] presenting for 6 week postpartum visit  Plan: Problem List Items Addressed This Visit    None    Visit Diagnoses    Encounter for postpartum visit    -  Primary   Encounter for IUD insertion           1) Contraception Education given regarding options for contraception, including IUD placement.  2)  Pap - ASCCP guidelines and rational discussed.  Patient opts for every 3 year screening interval  3) Patient underwent screening for postpartum depression with no concerns noted.  4) Rx metrogel for BV symptoms  5) Follow up 1 year for routine annual exam, 6 weeks  IUD string check

## 2017-03-29 ENCOUNTER — Encounter: Payer: Self-pay | Admitting: Obstetrics and Gynecology

## 2017-03-29 ENCOUNTER — Ambulatory Visit (INDEPENDENT_AMBULATORY_CARE_PROVIDER_SITE_OTHER): Payer: Medicaid Other | Admitting: Obstetrics and Gynecology

## 2017-03-29 DIAGNOSIS — N760 Acute vaginitis: Secondary | ICD-10-CM

## 2017-03-29 DIAGNOSIS — Z30430 Encounter for insertion of intrauterine contraceptive device: Secondary | ICD-10-CM

## 2017-03-29 DIAGNOSIS — B9689 Other specified bacterial agents as the cause of diseases classified elsewhere: Secondary | ICD-10-CM

## 2017-03-29 MED ORDER — METRONIDAZOLE 0.75 % VA GEL: 1.0000 | Freq: Every day | VAGINAL | 1 refills | Status: AC

## 2017-05-10 ENCOUNTER — Encounter: Payer: Self-pay | Admitting: Obstetrics and Gynecology

## 2017-05-10 ENCOUNTER — Ambulatory Visit (INDEPENDENT_AMBULATORY_CARE_PROVIDER_SITE_OTHER): Payer: Medicaid Other | Admitting: Obstetrics and Gynecology

## 2017-05-10 VITALS — BP 120/84 | HR 88 | Ht 61.0 in | Wt 181.0 lb

## 2017-05-10 DIAGNOSIS — Z30431 Encounter for routine checking of intrauterine contraceptive device: Secondary | ICD-10-CM

## 2017-05-10 DIAGNOSIS — Z309 Encounter for contraceptive management, unspecified: Secondary | ICD-10-CM

## 2017-05-10 NOTE — Progress Notes (Signed)
     Obstetrics & Gynecology Office Visit   Chief Complaint:  Chief Complaint  Patient presents with  . IUD string check    History of Present Illness: 26 y.o. patient presenting for follow up of Paragard IUD placement 6+ weeks ago.  She denies any complications since her IUD placement.  Still having some occasional spotting.  Has not attempted to feel strings.    Review of Systems: Review of Systems  Constitutional: Positive for fever. Negative for chills.  Gastrointestinal: Negative for abdominal pain and vomiting.    Past Medical History:  Past Medical History:  Diagnosis Date  . ADD (attention deficit disorder)   . BMI 34.0-34.9,adult   . Insufficient prenatal care    late entry to care (15wks 3/7)  . UTI (urinary tract infection) during pregnancy 08/30/2016    Past Surgical History:  Past Surgical History:  Procedure Laterality Date  . CESAREAN SECTION N/A 02/16/2017   Procedure: CESAREAN SECTION;  Surgeon: Vena Austria, MD;  Location: [facility] ORS;  Service: Obstetrics;  Laterality: N/A;    Gynecologic History: No LMP recorded.  Obstetric History: [id number]  Family History:  History reviewed. No pertinent family history.  Social History:  Social History   Social History  . Marital status: Single    Spouse name: N/A  . Number of children: N/A  . Years of education: N/A   Occupational History  . Not on file.   Social History Main Topics  . Smoking status: Never Smoker  . Smokeless tobacco: Never Used  . Alcohol use No  . Drug use: No  . Sexual activity: Yes   Other Topics Concern  . Not on file   Social History Narrative  . No narrative on file    Allergies:  No Known Allergies  Medications: Prior to Admission medications   Medication Sig Start Date End Date Taking? Authorizing Provider  PARAGARD INTRAUTERINE COPPER IU by Intrauterine route.   Yes [provider]    Physical Exam Vitals:  Vitals:   05/10/17 0846  BP: 120/84    Pulse: 88   No LMP recorded.  General: NAD HEENT: normocephalic, anicteric Pulmonary: No increased work of breathing Cardiovascular: RRR, distal pulses 2+ Genitourinary:  External: Normal external female genitalia.  Normal urethral meatus, normal  Bartholin's and Skene's glands.    Vagina: Normal vaginal mucosa, no evidence of prolapse.    Cervix: Grossly normal in appearance, no bleeding, IUD strings visualized 2cm  Uterus: Non-enlarged, mobile, normal contour.  No CMT  Adnexa: ovaries non-enlarged, no adnexal masses  Rectal: deferred  Lymphatic: no evidence of inguinal lymphadenopathy Extremities: no edema, erythema, or tenderness Neurologic: Grossly intact Psychiatric: mood appropriate, affect full  Female chaperone present for pelvic and breast  portions of the physical exam  Assessment: 26 y.o. [id number] No problem-specific Assessment & Plan notes found for this encounter.   Plan: Problem List Items Addressed This Visit    None     - IUD string visualized and proper length -   IUDs while effective at preventing pregnancy do not prevent transmission of sexually transmitted diseases and use of barrier methods for this purpose was discussed.  Low overall incidence of failure with 99.7% efficacy rate in typical use.   - If menstrual cycles heavier or irregular discussed switching to progestin secreting IUD or adding another form of contraception for cycle control

## 2017-09-05 ENCOUNTER — Ambulatory Visit (INDEPENDENT_AMBULATORY_CARE_PROVIDER_SITE_OTHER): Payer: BLUE CROSS/BLUE SHIELD | Admitting: Obstetrics and Gynecology

## 2017-09-05 ENCOUNTER — Encounter: Payer: Self-pay | Admitting: Obstetrics and Gynecology

## 2017-09-05 VITALS — BP 110/70 | HR 96 | Ht 61.0 in | Wt 189.0 lb

## 2017-09-05 DIAGNOSIS — N760 Acute vaginitis: Secondary | ICD-10-CM | POA: Diagnosis not present

## 2017-09-05 DIAGNOSIS — B9689 Other specified bacterial agents as the cause of diseases classified elsewhere: Secondary | ICD-10-CM | POA: Diagnosis not present

## 2017-09-05 MED ORDER — METRONIDAZOLE 0.75 % VA GEL: 1.0000 | Freq: Every day | VAGINAL | 1 refills | Status: DC

## 2017-09-05 NOTE — Progress Notes (Signed)
Obstetrics & Gynecology Office Visit   Chief Complaint:  Chief Complaint  Patient presents with  . Vaginitis    vaginal discharge and odor  . Menorrhagia    History of Present Illness: Ms. Maris BergerKeyona L Cadieux is a 27 y.o. [id number] who LMP was Patient's last menstrual period was 08/06/2017., presents today for a problem visit.   Patient complains of an abnormal vaginal discharge for 1 month. Discharge described as: thin, watery and malodorous. Vaginal symptoms include local irritation.Other associated symptoms: none.Menstrual pattern: She had been bleeding regularly. Contraception: IUD.  She denies recent antibiotic exposure, denies changes in soaps, detergents coinciding with the onset of her symptoms.  She has not previously self treated or been under treatment by another provider for these symptoms.   Review of Systems: Review of Systems  Constitutional: Negative for chills and fever.  Gastrointestinal: Negative for abdominal pain.  Genitourinary: Negative for dysuria, frequency, hematuria and urgency.  Skin: Negative for itching and rash.   Past Medical History:  Past Medical History:  Diagnosis Date  . ADD (attention deficit disorder)   . BMI 34.0-34.9,adult   . Insufficient prenatal care    late entry to care (15wks 3/7)  . UTI (urinary tract infection) during pregnancy 08/30/2016    Past Surgical History:  Past Surgical History:  Procedure Laterality Date  . CESAREAN SECTION N/A 02/16/2017   Procedure: CESAREAN SECTION;  Surgeon: Vena AustriaStaebler, Karthika Glasper, MD;  Location: [facility] ORS;  Service: Obstetrics;  Laterality: N/A;    Gynecologic History: Patient's last menstrual period was 08/06/2017.  Obstetric History: O9G2952: G4P2022  Family History:  No family history on file.  Social History:  Social History   Socioeconomic History  . Marital status: Single    Spouse name: Not on file  . Number of children: Not on file  . Years of education: Not on file  . Highest education  level: Not on file  Social Needs  . Financial resource strain: Not on file  . Food insecurity - worry: Not on file  . Food insecurity - inability: Not on file  . Transportation needs - medical: Not on file  . Transportation needs - non-medical: Not on file  Occupational History  . Not on file  Tobacco Use  . Smoking status: Never Smoker  . Smokeless tobacco: Never Used  Substance and Sexual Activity  . Alcohol use: No  . Drug use: No  . Sexual activity: Yes  Other Topics Concern  . Not on file  Social History Narrative  . Not on file    Allergies:  No Known Allergies  Medications: Prior to Admission medications   Medication Sig Start Date End Date Taking? Authorizing Provider  metroNIDAZOLE (METROGEL) 0.75 % vaginal gel Place 1 Applicatorful vaginally at bedtime for 5 days. 09/05/17 09/10/17  Vena AustriaStaebler, Shauntia Levengood, MD  PARAGARD INTRAUTERINE COPPER IU by Intrauterine route.    [provider]    Physical Exam Vitals:  Vitals:   09/05/17 1449  BP: 110/70  Pulse: 96   Patient's last menstrual period was 08/06/2017.  General: NAD HEENT: normocephalic, anicteric Pulmonary: No increased work of breathing Genitourinary:  External: Normal external female genitalia.  Normal urethral meatus, normal Bartholin's and Skene's glands.    Vagina: Normal vaginal mucosa, no evidence of prolapse.    Cervix: Grossly normal in appearance, no bleeding  Uterus: Non-enlarged, mobile, normal contour.  No CMT  Adnexa: ovaries non-enlarged, no adnexal masses  Rectal: deferred  Lymphatic: no evidence of inguinal lymphadenopathy Extremities:  no edema, erythema, or tenderness Neurologic: Grossly intact Psychiatric: mood appropriate, affect full  Female chaperone present for pelvic  portions of the physical exam  Wet Prep: PH: >4.5 Clue Cells: Positive Fungal elements: Negative Trichomonas: Negative  Assessment: 27 y.o. Z6X0960 with bacterial vaginosis  Plan: Problem List Items  Addressed This Visit    None    Visit Diagnoses    Bacterial vaginosis    -  Primary     1) Risk factors for bacterial vaginosis and candida infections discussed.  We discussed normal vaginal flora/microbiome.  Any factors that may alter the microbiome increase the risk of these opportunistic infections.  These include changes in pH, antibiotic exposures, diabetes, wet bathing suits etc.  We discussed that treatment is aimed at eradicating abnormal bacterial overgrowth and or yeast.  There may be some role for vaginal probiotics in restoring normal vaginal flora.   - patient request metrogel over po flagyl  2) A total of 15 minutes were spent in face-to-face contact with the patient during this encounter with over half of that time devoted to counseling and coordination of care.

## 2018-03-03 ENCOUNTER — Ambulatory Visit: Payer: BLUE CROSS/BLUE SHIELD | Admitting: Obstetrics and Gynecology

## 2018-03-28 ENCOUNTER — Telehealth: Payer: Self-pay

## 2018-03-28 NOTE — Telephone Encounter (Signed)
Huntley DecSara, please schedule patient for her yearly annual. Let her know she can talk to and get note at that time. Postpartum visit was 03/13/2017 so anytime, do not overbook, will be fine

## 2018-03-28 NOTE — Telephone Encounter (Signed)
Pt calling re pregnancy last year.  She needs some papers filled out to prove pregnant and issues she had with it for financial aid.  [phone]

## 2018-03-28 NOTE — Telephone Encounter (Signed)
Patient is schedule 04/17/18 for annual with AMS. Patient is needing proof of pregnancy from last year's pregnancy to help with financial aid. Please advise

## 2018-03-28 NOTE — Telephone Encounter (Signed)
Please let patient know her note is ready for pick up. Thanks

## 2018-03-31 NOTE — Telephone Encounter (Signed)
Patient is also needing a note explaining any restricts she is having due to limited time at work or more rest times. Please advise. Patient is aware her proof of pregnancy is up front to pick

## 2018-03-31 NOTE — Telephone Encounter (Signed)
Pt stated the last 3 months of her pregnancy she was having issues with weight gain and fatigue. So much she had to miss a few classes. Dr. Bonney AidStaebler had told her, at her discretion, she could cut back on her hours.   Pt aware message will be sent to AMS and he will look back in her OB chart to see if any of this was documented and if it was he would provide a note stating so. If no documentation, then no note can be provided. Pt will wait for a call back.  KJ CMA

## 2018-03-31 NOTE — Telephone Encounter (Signed)
Please advise 

## 2018-03-31 NOTE — Telephone Encounter (Signed)
There are no additional restriction that would have been in place after her initial 6 week postpartum visit

## 2018-04-17 ENCOUNTER — Ambulatory Visit: Payer: BLUE CROSS/BLUE SHIELD | Admitting: Obstetrics and Gynecology

## 2018-04-22 ENCOUNTER — Encounter: Payer: Self-pay | Admitting: Obstetrics and Gynecology

## 2018-04-22 ENCOUNTER — Other Ambulatory Visit (HOSPITAL_COMMUNITY)
Admission: RE | Admit: 2018-04-22 | Discharge: 2018-04-22 | Disposition: A | Discharge status: Home / Self Care | Payer: BLUE CROSS/BLUE SHIELD | Source: Ambulatory Visit | Attending: Obstetrics and Gynecology | Admitting: Obstetrics and Gynecology

## 2018-04-22 ENCOUNTER — Ambulatory Visit (INDEPENDENT_AMBULATORY_CARE_PROVIDER_SITE_OTHER): Payer: BLUE CROSS/BLUE SHIELD | Admitting: Obstetrics and Gynecology

## 2018-04-22 VITALS — BP 110/82 | HR 88 | Ht 61.0 in | Wt 190.0 lb

## 2018-04-22 DIAGNOSIS — Z113 Encounter for screening for infections with a predominantly sexual mode of transmission: Secondary | ICD-10-CM

## 2018-04-22 DIAGNOSIS — Z30431 Encounter for routine checking of intrauterine contraceptive device: Secondary | ICD-10-CM | POA: Diagnosis not present

## 2018-04-22 DIAGNOSIS — Z01419 Encounter for gynecological examination (general) (routine) without abnormal findings: Secondary | ICD-10-CM | POA: Diagnosis present

## 2018-04-22 NOTE — Progress Notes (Signed)
Gynecology Annual Exam  PCP: Patient, No Pcp Per  Chief Complaint:  Chief Complaint  Patient presents with  . Gynecologic Exam    BV? /itching/discharge/slight odor    History of Present Illness: Patient is a 27 y.o. Z6X0960 presents for annual exam. The patient has no complaints today.   LMP: Patient's last menstrual period was 04/06/2018. Average Interval: regular, 28 days Duration of flow: 6 days Heavy Menses: yes Clots: yes Intermenstrual Bleeding: no Postcoital Bleeding: no Dysmenorrhea: yes  The patient is sexually active. She currently uses IUD for contraception. She has dyspareunia.  The patient does perform self breast exams.  There is no notable family history of breast or ovarian cancer in her family.  The patient wears seatbelts: yes.  The patient has regular exercise: not asked.    The patient denies current symptoms of depression.    Review of Systems: Review of Systems  Constitutional: Negative for chills and fever.  HENT: Negative for congestion.   Respiratory: Negative for cough and shortness of breath.   Cardiovascular: Negative for chest pain and palpitations.  Gastrointestinal: Negative for abdominal pain, constipation, diarrhea, heartburn, nausea and vomiting.  Genitourinary: Negative for dysuria, frequency and urgency.  Skin: Negative for itching and rash.  Neurological: Negative for dizziness and headaches.  Endo/Heme/Allergies: Negative for polydipsia.  Psychiatric/Behavioral: Negative for depression.    Past Medical History:  Past Medical History:  Diagnosis Date  . ADD (attention deficit disorder)   . BMI 34.0-34.9,adult   . Insufficient prenatal care    late entry to care (15wks 3/7)  . UTI (urinary tract infection) during pregnancy 08/30/2016    Past Surgical History:  Past Surgical History:  Procedure Laterality Date  . CESAREAN SECTION N/A 02/16/2017   Procedure: CESAREAN SECTION;  Surgeon: Vena Austria, MD;  Location:  [facility] ORS;  Service: Obstetrics;  Laterality: N/A;    Gynecologic History:  Patient's last menstrual period was 04/06/2018. Contraception: IUD Paraguard IUD 03/29/17 Last Pap: Results were: 08/30/16 no abnormalities   Obstetric History: [id number]  Family History:  History reviewed. No pertinent family history.  Social History:  Social History   Socioeconomic History  . Marital status: Single    Spouse name: Not on file  . Number of children: Not on file  . Years of education: Not on file  . Highest education level: Not on file  Occupational History  . Not on file  Social Needs  . Financial resource strain: Not on file  . Food insecurity:    Worry: Not on file    Inability: Not on file  . Transportation needs:    Medical: Not on file    Non-medical: Not on file  Tobacco Use  . Smoking status: Never Smoker  . Smokeless tobacco: Never Used  Substance and Sexual Activity  . Alcohol use: No  . Drug use: No  . Sexual activity: Yes  Lifestyle  . Physical activity:    Days per week: Not on file    Minutes per session: Not on file  . Stress: Not on file  Relationships  . Social connections:    Talks on phone: Not on file    Gets together: Not on file    Attends religious service: Not on file    Active member of club or organization: Not on file    Attends meetings of clubs or organizations: Not on file    Relationship status: Not on file  . Intimate partner violence:  Fear of current or ex partner: Not on file    Emotionally abused: Not on file    Physically abused: Not on file    Forced sexual activity: Not on file  Other Topics Concern  . Not on file  Social History Narrative  . Not on file    Allergies:  No Known Allergies  Medications: Prior to Admission medications   Medication Sig Start Date End Date Taking? Authorizing Provider  PARAGARD INTRAUTERINE COPPER IU by Intrauterine route.    [provider]    Physical Exam Vitals: Blood pressure  110/82, pulse 88, height 5\' 1"  (1.549 m), weight 190 lb (86.2 kg), last menstrual period 04/06/2018, not currently breastfeeding.  General: NAD HEENT: normocephalic, anicteric Thyroid: no enlargement, no palpable nodules Pulmonary: No increased work of breathing, CTAB Cardiovascular: RRR, distal pulses 2+ Breast: Breast symmetrical, no tenderness, no palpable nodules or masses, no skin or nipple retraction present, no nipple discharge.  No axillary or supraclavicular lymphadenopathy. Abdomen: NABS, soft, non-tender, non-distended.  Umbilicus without lesions.  No hepatomegaly, splenomegaly or masses palpable. No evidence of hernia  Genitourinary:  External: Normal external female genitalia.  Normal urethral meatus, normal Bartholin's and Skene's glands.    Vagina: Normal vaginal mucosa, no evidence of prolapse.    Cervix: Grossly normal in appearance, no bleeding, IUD strings visualized 2cm  Uterus: Non-enlarged, mobile, normal contour.  No CMT  Adnexa: ovaries non-enlarged, no adnexal masses  Rectal: deferred  Lymphatic: no evidence of inguinal lymphadenopathy Extremities: no edema, erythema, or tenderness Neurologic: Grossly intact Psychiatric: mood appropriate, affect full  Female chaperone present for pelvic and breast  portions of the physical exam    Assessment: 27 y.o. Z6X0960G4P2022 routine annual exam  Plan: Problem List Items Addressed This Visit    None    Visit Diagnoses    Encounter for gynecological examination without abnormal finding    -  Primary   Relevant Orders   Cervicovaginal ancillary only   HEP, RPR, HIV Panel   IUD check up       Routine screening for STI (sexually transmitted infection)       Relevant Orders   Cervicovaginal ancillary only   HEP, RPR, HIV Panel      1) 4) Gardasil Series discussed and if applicable offered to patient - Patient has previously completed 3 shot series   2) STI screening  wasoffered and accepted  3)  ASCCP guidelines and  rational discussed.  Patient opts for every 3 years screening interval  4) Contraception - the patient is currently using  IUD.  She is interested in changing to [place]rena given heavier cycles with Paraguard We discussed safe sex practices to reduce her furture risk of STI's.    5) Return in about 1 week (around 04/29/2018) for as early as 9/20 for Mirena IUD insertion if Mirena available.   Vena AustriaAndreas Teyton Pattillo, MD, Merlinda FrederickFACOG [facility], [facility]

## 2018-04-23 LAB — HEP, RPR, HIV PANEL
HIV SCREEN 4TH GENERATION: NONREACTIVE
Hepatitis B Surface Ag: NEGATIVE
RPR: NONREACTIVE

## 2018-04-24 LAB — CERVICOVAGINAL ANCILLARY ONLY
Chlamydia: NEGATIVE
Neisseria Gonorrhea: NEGATIVE
Trichomonas: NEGATIVE

## 2018-06-20 ENCOUNTER — Other Ambulatory Visit: Payer: Self-pay | Admitting: Obstetrics and Gynecology

## 2018-06-20 MED ORDER — METRONIDAZOLE 0.75 % VA GEL: 1.0000 | Freq: Every day | VAGINAL | 1 refills | Status: AC

## 2018-11-06 ENCOUNTER — Telehealth: Payer: Self-pay

## 2018-11-06 NOTE — Telephone Encounter (Signed)
MyChart message sent to patient with advise.

## 2018-11-06 NOTE — Telephone Encounter (Signed)
Pt called c/o nausea, stomach cramps for the past 2wks.  Not sure if side effect of IUD.  Pepto not working.  [phone]

## 2018-11-17 ENCOUNTER — Telehealth: Payer: Self-pay | Admitting: Obstetrics and Gynecology

## 2018-11-17 ENCOUNTER — Other Ambulatory Visit: Payer: Self-pay | Admitting: Obstetrics and Gynecology

## 2018-11-17 DIAGNOSIS — O9989 Other specified diseases and conditions complicating pregnancy, childbirth and the puerperium: Secondary | ICD-10-CM

## 2018-11-17 DIAGNOSIS — Z3490 Encounter for supervision of normal pregnancy, unspecified, unspecified trimester: Secondary | ICD-10-CM

## 2018-11-17 DIAGNOSIS — T8331XA Breakdown (mechanical) of intrauterine contraceptive device, initial encounter: Secondary | ICD-10-CM

## 2018-11-17 NOTE — Telephone Encounter (Signed)
From: Shelly Shah Sent: 11/15/2018   9:15 AM EDT To: Ws-Admin Subject: Appointment Request                            Appointment Request From: Shelly Shah  With Provider: Vena Austria, MD [[facility]]  Preferred Date Range: 11/17/2018 - 11/18/2018  Preferred Times: Any time  Reason for visit: Office Visit  Comments: I took pregnancy tests and both came out positive with the Paraguard IUD. I am very very worried and concerned as to how this happened..   Rounting to Dr. Jerene Pitch due to AMS no in office

## 2018-11-21 ENCOUNTER — Other Ambulatory Visit: Payer: Self-pay

## 2018-11-21 ENCOUNTER — Ambulatory Visit
Admission: RE | Admit: 2018-11-21 | Discharge: 2018-11-21 | Disposition: A | Discharge status: Home / Self Care | Payer: BLUE CROSS/BLUE SHIELD | Source: Ambulatory Visit | Attending: Obstetrics and Gynecology | Admitting: Obstetrics and Gynecology

## 2018-11-21 ENCOUNTER — Encounter: Payer: Self-pay | Admitting: Obstetrics and Gynecology

## 2018-11-21 ENCOUNTER — Ambulatory Visit (INDEPENDENT_AMBULATORY_CARE_PROVIDER_SITE_OTHER): Payer: BLUE CROSS/BLUE SHIELD | Admitting: Obstetrics and Gynecology

## 2018-11-21 VITALS — BP 100/70 | Ht 61.0 in | Wt 190.0 lb

## 2018-11-21 DIAGNOSIS — O219 Vomiting of pregnancy, unspecified: Secondary | ICD-10-CM

## 2018-11-21 DIAGNOSIS — O9989 Other specified diseases and conditions complicating pregnancy, childbirth and the puerperium: Secondary | ICD-10-CM | POA: Insufficient documentation

## 2018-11-21 DIAGNOSIS — Z3490 Encounter for supervision of normal pregnancy, unspecified, unspecified trimester: Secondary | ICD-10-CM | POA: Insufficient documentation

## 2018-11-21 DIAGNOSIS — T8331XA Breakdown (mechanical) of intrauterine contraceptive device, initial encounter: Secondary | ICD-10-CM | POA: Insufficient documentation

## 2018-11-21 DIAGNOSIS — Z30432 Encounter for removal of intrauterine contraceptive device: Secondary | ICD-10-CM | POA: Diagnosis not present

## 2018-11-21 DIAGNOSIS — O2631 Retained intrauterine contraceptive device in pregnancy, first trimester: Secondary | ICD-10-CM

## 2018-11-21 DIAGNOSIS — Z331 Pregnant state, incidental: Secondary | ICD-10-CM

## 2018-11-21 MED ORDER — DOXYLAMINE-PYRIDOXINE 10-10 MG PO TBEC: 2.0000 | DELAYED_RELEASE_TABLET | Freq: Every day | ORAL | 5 refills | Status: DC

## 2018-11-21 NOTE — Progress Notes (Signed)
Gynecology Ultrasound Follow Up   Chief Complaint  Patient presents with   Follow-up    u/s  Positive pregnancy test with Paragard IUD in place   History of Present Illness: Patient is a 28 y.o. female who presents today for ultrasound evaluation of the above .  Ultrasound demonstrates the following findings: Single living IUP with yolk sac visualized.  Cardiac activity noted at 163 bpm (CRL 22 mm), current [facility] 2051w6d [facility]   IUD noted to be within cervix with crossbar below the gestational sac in the uterus.    The patient denies bleeding. She desires to have her IUD removed after discussing the risks and benefits.     Past Medical History:  Diagnosis Date   ADD (attention deficit disorder)    BMI 34.0-34.9,adult    Insufficient prenatal care    late entry to care (15wks 3/7)   UTI (urinary tract infection) during pregnancy 08/30/2016    Past Surgical History:  Procedure Laterality Date   CESAREAN SECTION N/A 02/16/2017   Procedure: CESAREAN SECTION;  Surgeon: Vena AustriaStaebler, Andreas, MD;  Location: [facility] ORS;  Service: Obstetrics;  Laterality: N/A;   Family History: denies history of gynecologic cancer   Social History   Socioeconomic History   Marital status: Single    Spouse name: Not on file   Number of children: Not on file   Years of education: Not on file   Highest education level: Not on file  Occupational History   Not on file  Social Needs   Financial resource strain: Not on file   Food insecurity:    Worry: Not on file    Inability: Not on file   Transportation needs:    Medical: Not on file    Non-medical: Not on file  Tobacco Use   Smoking status: Never Smoker   Smokeless tobacco: Never Used  Substance and Sexual Activity   Alcohol use: No   Drug use: No   Sexual activity: Yes    Birth control/protection: I.U.D.    Comment: Paragard  Lifestyle   Physical activity:    Days per week: Not on file    Minutes  per session: Not on file   Stress: Not on file  Relationships   Social connections:    Talks on phone: Not on file    Gets together: Not on file    Attends religious service: Not on file    Active member of club or organization: Not on file    Attends meetings of clubs or organizations: Not on file    Relationship status: Not on file   Intimate partner violence:    Fear of current or ex partner: Not on file    Emotionally abused: Not on file    Physically abused: Not on file    Forced sexual activity: Not on file  Other Topics Concern   Not on file  Social History Narrative   Not on file    No Known Allergies  Prior to Admission medications   Medication Sig Start Date End Date Taking? Authorizing Provider  PARAGARD INTRAUTERINE COPPER IU by Intrauterine route.   Yes [provider]  VYVANSE 50 MG CHEW  10/29/18  Yes [provider]  Doxylamine-Pyridoxine (DICLEGIS) 10-10 MG TBEC Take 2 tablets by mouth at bedtime. If symptoms persist, add one tablet in the morning and one in the afternoon 11/21/18   Conard NovakJackson, Duward Allbritton D, MD    Physical Exam BP 100/70  Ht 5\' 1"  (1.549 m)    Wt 190 lb (86.2 kg)    LMP 09/15/2018 (Approximate)    Breastfeeding No    BMI 35.90 kg/m    Physical Exam Constitutional:      General: She is not in acute distress.    Appearance: Normal appearance. She is well-developed.  Genitourinary:     Pelvic exam was performed with patient in the lithotomy position.     Vulva, inguinal canal, urethra, bladder, vagina, uterus, right adnexa and left adnexa normal.     No posterior fourchette tenderness, injury or lesion present.     No cervical friability, lesion, bleeding or polyp.     IUD strings visualized.  HENT:     Head: Normocephalic and atraumatic.  Eyes:     General: No scleral icterus.    Conjunctiva/sclera: Conjunctivae normal.  Neck:     Musculoskeletal: Normal range of motion and neck supple.  Cardiovascular:     Rate and  Rhythm: Normal rate and regular rhythm.     Heart sounds: No murmur. No friction rub. No gallop.   Pulmonary:     Effort: Pulmonary effort is normal. No respiratory distress.     Breath sounds: Normal breath sounds. No wheezing or rales.  Abdominal:     General: Bowel sounds are normal. There is no distension.     Palpations: Abdomen is soft. There is no mass.     Tenderness: There is no abdominal tenderness. There is no guarding or rebound.  Musculoskeletal: Normal range of motion.  Neurological:     General: No focal deficit present.     Mental Status: She is alert and oriented to person, place, and time.     Cranial Nerves: No cranial nerve deficit.  Skin:    General: Skin is warm and dry.     Findings: No erythema.  Psychiatric:        Mood and Affect: Mood normal.        Behavior: Behavior normal.        Judgment: Judgment normal.   IUD Removal  Patient identified, informed consent performed, consent signed.  Discussed risks and benefits of removing the IUD while pregnant.  She understands that removal of the IUD could result in abortion of the pregnancy.  Given the location of the IUD, I believe this risk is low, but not zero.  Alternatively, the IUD could cause problems if left in place.  There is data to support second trimester or early third trimester complications with the pregnancy necessitating preterm delivery and the risks that might represent to the baby.  With the IUD removed, this would minimize those risks.  She voiced understanding of the risks and elected to have the IUD removed. Patient was in the dorsal lithotomy position, normal external genitalia was noted.  A speculum was placed in the patient's vagina, normal discharge was noted, no lesions. The cervix was visualized, no lesions, no abnormal discharge.  The strings of the IUD were grasped and pulled using ring forceps. The IUD was removed in its entirety. Patient tolerated the procedure well.  No bleeding noted  coming from cervix after removal.  She did not state she had any bleeding prior to leaving the clinic.   Female chaperone present for pelvic exam:   Assessment: 28 y.o. [id number]  1. Retained intrauterine device (IUD) during pregnancy in first trimester   2. Nausea/vomiting in pregnancy   3. Encounter for IUD removal      Plan:  Problem List Items Addressed This Visit    None    Visit Diagnoses    Retained intrauterine device (IUD) during pregnancy in first trimester    -  Primary   Relevant Medications   Doxylamine-Pyridoxine (DICLEGIS) 10-10 MG TBEC   Nausea/vomiting in pregnancy       Relevant Medications   Doxylamine-Pyridoxine (DICLEGIS) 10-10 MG TBEC   Encounter for IUD removal         IUD removed successfully and intact. She believes she will move forward with the pregnancy. She will establish care as a new obstetrical patient at a subsequent visit.    EDD by [redacted]w[redacted]d ultrasound on 4/17/202 is 06/27/2019.  20 minutes spent in face to face discussion with > 50% spent in counseling,management, and coordination of care of her Retained IUD during pregnancy.   Thomasene Mohair, MD, [facility], [facility] 11/21/2018 7:00 PM

## 2018-11-24 ENCOUNTER — Other Ambulatory Visit: Payer: Self-pay | Admitting: Maternal Newborn

## 2018-11-24 DIAGNOSIS — Z6834 Body mass index (BMI) 34.0-34.9, adult: Secondary | ICD-10-CM

## 2018-11-24 DIAGNOSIS — Z3480 Encounter for supervision of other normal pregnancy, unspecified trimester: Secondary | ICD-10-CM

## 2018-11-24 NOTE — Progress Notes (Signed)
Needs 1 hour GTT at NOB visit.

## 2018-11-28 ENCOUNTER — Encounter: Payer: Self-pay | Admitting: Maternal Newborn

## 2018-11-28 ENCOUNTER — Ambulatory Visit (INDEPENDENT_AMBULATORY_CARE_PROVIDER_SITE_OTHER): Payer: BLUE CROSS/BLUE SHIELD | Admitting: Maternal Newborn

## 2018-11-28 ENCOUNTER — Other Ambulatory Visit: Payer: BLUE CROSS/BLUE SHIELD

## 2018-11-28 ENCOUNTER — Other Ambulatory Visit: Payer: Self-pay

## 2018-11-28 ENCOUNTER — Other Ambulatory Visit (HOSPITAL_COMMUNITY)
Admission: RE | Admit: 2018-11-28 | Discharge: 2018-11-28 | Disposition: A | Discharge status: Home / Self Care | Payer: BLUE CROSS/BLUE SHIELD | Source: Ambulatory Visit | Attending: Maternal Newborn | Admitting: Maternal Newborn

## 2018-11-28 VITALS — BP 110/70 | Wt 190.0 lb

## 2018-11-28 DIAGNOSIS — O26899 Other specified pregnancy related conditions, unspecified trimester: Secondary | ICD-10-CM | POA: Insufficient documentation

## 2018-11-28 DIAGNOSIS — Z3480 Encounter for supervision of other normal pregnancy, unspecified trimester: Secondary | ICD-10-CM

## 2018-11-28 DIAGNOSIS — Z124 Encounter for screening for malignant neoplasm of cervix: Secondary | ICD-10-CM | POA: Insufficient documentation

## 2018-11-28 DIAGNOSIS — N898 Other specified noninflammatory disorders of vagina: Secondary | ICD-10-CM | POA: Insufficient documentation

## 2018-11-28 DIAGNOSIS — Z113 Encounter for screening for infections with a predominantly sexual mode of transmission: Secondary | ICD-10-CM | POA: Insufficient documentation

## 2018-11-28 DIAGNOSIS — Z3A09 9 weeks gestation of pregnancy: Secondary | ICD-10-CM

## 2018-11-28 DIAGNOSIS — Z6834 Body mass index (BMI) 34.0-34.9, adult: Secondary | ICD-10-CM

## 2018-11-28 DIAGNOSIS — O26891 Other specified pregnancy related conditions, first trimester: Secondary | ICD-10-CM

## 2018-11-28 NOTE — Progress Notes (Signed)
NOB/GTT- little bleeding when wiped only after IUD insertion (one day thing)

## 2018-11-28 NOTE — Progress Notes (Signed)
11/28/2018   Chief Complaint: Desires prenatal care.  Transfer of Care Patient: no  History of Present Illness: Shelly Shah is a 28 y.o. [id number] at 2882w6d based on Ultrasound, with an Estimated Date of Delivery: 06/27/2019, with the above CC.   Her periods were: regular periods every 28 days She was using Paragard IUD when she conceived, removed on 4/17 without complications.  She has Positive signs or symptoms of nausea/vomiting of pregnancy. She has Negative signs or symptoms of miscarriage or preterm labor; had one day of seeing a small amount of blood when wiping after IUD was removed, no bleeding since then. She identifies Negative Zika risk factors for her and her partner On any different medications around the time she conceived/early pregnancy: Yes, taking Vyvanse, discussed risks/benefits of medication and she elects to continue for now. History of varicella: No    Review of Systems  Constitutional: Negative.   HENT: Negative.   Eyes: Negative.   Respiratory: Positive for shortness of breath. Negative for cough and wheezing.        Feels mild shortness of breath with exercise, no difficulty breathing  Cardiovascular: Negative for chest pain and palpitations.  Gastrointestinal: Positive for nausea. Negative for abdominal pain.  Genitourinary:       Vaginal discharge, possible return of BV  Musculoskeletal: Negative.   Skin: Negative.   Neurological: Negative.   Endo/Heme/Allergies: Negative.   Psychiatric/Behavioral: Negative.    Review of systems was otherwise negative, except as stated in the above HPI.  OBGYN History: As per HPI. OB History  Gravida Para Term Preterm AB Living  4 2 2  0 1 2  SAB TAB Ectopic Multiple Live Births  1 0 0 0 2    # Outcome Date GA Lbr Len/2nd Weight Sex Delivery Anes PTL Lv  4 Current           3 Term 02/16/17 5695w5d  8 lb 7.1 oz (3.83 kg) M CS-LTranv EPI  LIV  2 Term 09/30/09 4071w0d  6 lb 11 oz (3.033 kg) M Vag-Spont   LIV  1 SAB              Any issues with any prior pregnancies: G2: Active phase arrest of labor with pLTCS Any prior children are healthy, doing well, without any problems or issues: yes History of pap smears: Yes. Last pap smear 2018. Abnormal: ASCUS History of STIs: Yes, histroy of chlamydia in 2016   Past Medical History: Past Medical History:  Diagnosis Date  . ADD (attention deficit disorder)   . BMI 34.0-34.9,adult   . Insufficient prenatal care    late entry to care (15wks 3/7)  . UTI (urinary tract infection) during pregnancy 08/30/2016    Past Surgical History: Past Surgical History:  Procedure Laterality Date  . CESAREAN SECTION N/A 02/16/2017   Procedure: CESAREAN SECTION;  Surgeon: Vena AustriaStaebler, Andreas, MD;  Location: [facility] ORS;  Service: Obstetrics;  Laterality: N/A;    Family History:  Negative/unremarkable except as detailed in HPI. She denies any female cancers, bleeding or blood clotting disorders.  She denies any history of intellectual disability, birth defects or genetic disorders in her or the FOB's history  Social History:  Social History   Socioeconomic History  . Marital status: Single    Spouse name: Not on file  . Number of children: Not on file  . Years of education: Not on file  . Highest education level: Not on file  Occupational History  . Not on file  Social Needs  . Financial resource strain: Not on file  . Food insecurity:    Worry: Not on file    Inability: Not on file  . Transportation needs:    Medical: Not on file    Non-medical: Not on file  Tobacco Use  . Smoking status: Never Smoker  . Smokeless tobacco: Never Used  Substance and Sexual Activity  . Alcohol use: No  . Drug use: No  . Sexual activity: Yes    Partners: Male    Birth control/protection: None  Lifestyle  . Physical activity:    Days per week: Not on file    Minutes per session: Not on file  . Stress: Not on file  Relationships  . Social connections:    Talks on phone:  Not on file    Gets together: Not on file    Attends religious service: Not on file    Active member of club or organization: Not on file    Attends meetings of clubs or organizations: Not on file    Relationship status: Not on file  . Intimate partner violence:    Fear of current or ex partner: Not on file    Emotionally abused: Not on file    Physically abused: Not on file    Forced sexual activity: Not on file  Other Topics Concern  . Not on file  Social History Narrative  . Not on file   Any cats in the household: no Domestic violence screening is negative.  Allergy: No Known Allergies  Current Outpatient Medications:  Current Outpatient Medications:  .  VYVANSE 50 MG CHEW, , Disp: , Rfl:  .  Doxylamine-Pyridoxine (DICLEGIS) 10-10 MG TBEC, Take 2 tablets by mouth at bedtime. If symptoms persist, add one tablet in the morning and one in the afternoon (Patient not taking: Reported on 11/28/2018), Disp: 120 tablet, Rfl: 5   Physical Exam:   BP 110/70   Wt 190 lb (86.2 kg)   LMP 09/15/2018 (Approximate)   BMI 35.90 kg/m  Body mass index is 35.9 kg/m. Constitutional: Well nourished, well developed female in no acute distress.  Neck:  Supple, normal appearance, and no thyromegaly  Cardiovascular: S1, S2 normal, no murmur, rub or gallop, regular rate and rhythm Respiratory:  Clear to auscultation bilaterally. Normal respiratory effort Abdomen: Non tender, non distended Breasts: patient declines to have breast exam. Neuro/Psych:  Normal mood and affect.  Skin:  Warm and dry.  Lymphatic:  No inguinal lymphadenopathy.   Pelvic exam: is not limited by body habitus External genitalia, Bartholin's glands, Urethra, Skene's glands: within normal limits Vagina: within normal limits and with no blood in the vault  Cervix: normal appearing cervix without discharge or lesions, closed/long/high, moderate amount of thin white discharge Uterus:  normal contour Adnexa:  no mass,  fullness, tenderness  Assessment: Shelly Shah is a 28 y.o. Z6X0960 at [redacted]w[redacted]d based on Ultrasound with an Estimated Date of Delivery: 06/22/2019, presenting for prenatal care.  Plan:  1) Avoid alcoholic beverages. 2) Patient encouraged not to smoke.  3) Discontinue the use of all non-medicinal drugs and chemicals.  4) Take prenatal vitamins daily.  5) Seatbelt use advised 6) Nutrition, food safety (fish, cheese advisories, and high nitrite foods) and exercise discussed. 7) Hospital and practice style; delivering at [facility] discussed  8) Patient is asked about travel to areas at risk for the Zika virus, and counseled to avoid travel and exposure to mosquitoes or partners who may have themselves been  exposed to the virus.  9) Prenatal classes at [facility] advised when available 10) Genetic Screening, such as with 1st Trimester Screening, cell free fetal DNA, AFP testing, and Ultrasound, as well as with amniocentesis and CVS as appropriate, is discussed with patient. She plans to have genetic testing this pregnancy. 11) NOB labs and GTT today. 12) Pap done and Aptima sent to rule out BV recurrence. 13) Waiting to see if insurance will cover Diclegis; if not, offer OTC alternative.  Problem list reviewed and updated.  Marcelyn Bruins, CNM [facility], [facility] 11/28/2018

## 2018-11-28 NOTE — Patient Instructions (Addendum)
Hello,  Given the current COVID-19 pandemic, our practice is making changes in how we are providing care to our patients. We are limiting in-person visits for the safety of all of our patients.   As a practice, we have met to discuss the best way to minimize visits, but still provide excellent care to our expecting mothers.  We have decided on the following visit structure for low-risk pregnancies.  Initial Pregnancy visit will be conducted as a telephone or web visit.  Between 10-14 weeks  there will be one in-person visit for an ultrasound, lab work, and genetic screening. 20 weeks in-person visit with an anatomy ultrasound  28 weeks in-person office visit for a 1-hour glucose test and a TDAP vaccination 32 weeks in-person office visit 34 weeks telephone visit 36 weeks in-person office visit for GBS, chlamydia, and gonorrhea testing 38 weeks in-person office visit 40 weeks in-person office visit  Understandably, some patients will require more visits than what is outlined above. Additional visits will be determined on a case-by-case basis.   We will, as always, be available for emergencies or to address concerns that might arise between in-person visits. We ask that you allow [place] the opportunity to address any concerns over the phone or through a virtual visit first. We will be available to return your phone calls throughout the day.   If you are able to purchase a scale, a blood pressure machine, and a home fetal doppler visits could be limited further. This will help decrease your exposure risks, but these purchases are not a necessity.   Things seem to change daily and there is the possibility that this structure could change, please be patient as we adapt to a new way of caring for patients.   Thank you for trusting [place] with your prenatal care. Our practice values you and looks forward to providing you with excellent care.   Sincerely,   [facility], [place]     COVID-19 and Your Pregnancy FAQ  How can I prevent infection with COVID-19 during my pregnancy? Social distancing is key. Please limit any interactions in public. Try and work from home if possible. Frequently wash your hands after touching possibly contaminated surfaces. Avoid touching your face.  Minimize trips to the store. Consider online ordering when possible.   Should I wear a mask? YES. It is recommended by the CDC that all people wear a cloth mask or facial covering in public. This will help reduce transmission as well as your risk or acquiring COVID-19. New studies are showing that even asymptomatic individuals can spread the virus from talking.   What are the symptoms of COVID-19? Fever (greater than 100.4 F), dry cough, shortness of breath.  Am I more at risk for COVID-19 since I am pregnant? There is not currently data showing that pregnant women are more adversely impacted by COVID-19 than the general population. However, we know that pregnant women tend to have worse respiratory complications from similar diseases such as the flu and SARS and for this reason should be considered an at-risk population.  What do I do if I am experiencing the symptoms of COVID-19? Testing is being limited because of test availability. If you are experiencing symptoms you should quarantine yourself, and the members of your family, for at least 2 weeks at home.   Please visit this website for more information: [email or web address]  When should I go to the Emergency Room? Please go to the emergency room if you  are experiencing ANY of these symptoms*:  1.    Difficulty breathing or shortness of breath 2.    Persistent pain or pressure in the chest 3.    Confusion or difficulty being aroused (or awakened) 4.    Bluish lips or face  *This list is not all inclusive. Please consult our office for any other symptoms that are severe or  concerning.  What do I do if I am having difficulty breathing? You should go to the Emergency Room for evaluation. At this time they have a tent set up for evaluating patients with COVID-19 symptoms.   How will my prenatal care be different because of the COVID-19 pandemic? It has been recommended to reduce the frequency of face-to-face visits and use resources such as telephone and virtual visits when possible. Using a scale, blood pressure machine and fetal doppler at home can further help reduce face-to-face visits. You will be provided with additional information on this topic.  We ask that you come to your visits alone to minimize potential exposures to  COVID-19.  How can I receive childbirth education? At this time in-person classes have been cancelled. You can register for online childbirth education, breastfeeding, and newborn care classes.  Please visit:  [email or web address] for more information  How will my hospital birth experience be different? The hospital is currently limiting visitors. This means that while you are in labor you can only have one person at the hospital with you. Additional family members will not be allowed to wait in the building or outside your room. Your one support person can be the father of the baby, a relative, a doula, or a friend. Once one support person is designated that person will wear a band. This band cannot be shared with multiple people.  Nitrous Gas is not being offered for pain relief since the tubing and filter for the machine can not be sanitized in a way to guarantee prevention of transmission of COVID-19.  Nasal cannula use of oxygen for fetal indications has also been discontinued.  Currently a clear plastic sheet is being hung between mom and the delivering provider during pushing and delivery to help prevent transmission of COVID-19.      How long will I stay in the hospital for after giving birth? It is also recommended that  discharge home be expedited during the COVID-19 outbreak. This means staying for 1 day after a vaginal delivery and 2 days after a cesarean section. Patients who need to stay longer for medical reasons are allowed to do so, but the goal will be for expedited discharge home.   What if I have COVID-19 and I am in labor? We ask that you wear a mask while on labor and delivery. We will try and accommodate you being placed in a room that is capable of filtering the air. Please call ahead if you are in labor and on your way to the hospital. The phone number for labor and delivery at [facility] is [phone].  If I have COVID-19 when my baby is born how can I prevent my baby from contracting COVID-19? This is an issue that will have to be discussed on a case-by-case basis. Current recommendations suggest providing separate isolation rooms for both the mother and new infant as well as limiting visitors. However, there are practical challenges to this recommendation. The situation will assuredly change and decisions will be influenced by the desires of the mother and availability of space.    Some suggestions are the use of a curtain or physical barrier between mom and infant, hand hygiene, mom wearing a mask, or 6 feet of spacing between a mom and infant.   Can I breastfeed during the COVID-19 pandemic?   Yes, breastfeeding is encouraged.  Can I breastfeed if I have COVID-19? Yes. Covid-19 has not been found in breast milk. This means you cannot give COVID-19 to your child through breast milk. Breast feeding will also help pass antibodies to fight infection to your baby.   What precautions should I take when breastfeeding if I have COVID-19? If a mother and newborn do room-in and the mother wishes to feed at the breast, she should put on a facemask and practice hand hygiene before each feeding.  What precautions should I take when pumping if I have COVID-19? Prior to expressing  breast milk, mothers should practice hand hygiene. After each pumping session, all parts that come into contact with breast milk should be thoroughly washed and the entire pump should be appropriately disinfected per the manufacturer's instructions. This expressed breast milk should be fed to the newborn by a healthy caregiver.  What if I am pregnant and work in healthcare? Based on limited data regarding COVID-19 and pregnancy, ACOG currently does not propose creating additional restrictions on pregnant health care personnel because of COVID-19 alone. Pregnant women do not appear to be at higher risk of severe disease related to COVID-19. Pregnant health care personnel should follow CDC risk assessment and infection control guidelines for health care personnel exposed to patients with suspected or confirmed COVID-19. Adherence to recommended infection prevention and control practices is an important part of protecting all health care personnel in health care settings.    Information on COVID-19 in pregnancy is very limited; however, facilities may want to consider limiting exposure of pregnant health care personnel to patients with confirmed or suspected COVID-19 infection, especially during higher-risk procedures (eg, aerosol-generating procedures), if feasible, based on staffing availability.

## 2018-11-29 LAB — GLUCOSE, 1 HOUR GESTATIONAL: Gestational Diabetes Screen: 117 mg/dL (ref 65–139)

## 2018-12-01 LAB — RPR+RH+ABO+RUB AB+AB SCR+CB...
Antibody Screen: NEGATIVE
HIV Screen 4th Generation wRfx: NONREACTIVE
Hematocrit: 34 % (ref 34.0–46.6)
Hemoglobin: 11.1 g/dL (ref 11.1–15.9)
Hepatitis B Surface Ag: NEGATIVE
MCH: 28.1 pg (ref 26.6–33.0)
MCHC: 32.6 g/dL (ref 31.5–35.7)
MCV: 86 fL (ref 79–97)
Platelets: 188 10*3/uL (ref 150–450)
RBC: 3.95 x10E6/uL (ref 3.77–5.28)
RDW: 12.9 % (ref 11.7–15.4)
RPR Ser Ql: NONREACTIVE
Rh Factor: POSITIVE
Rubella Antibodies, IGG: 1.84 index (ref >0.99)
Varicella zoster IgG: 175 index (ref >165)
WBC: 5.7 10*3/uL (ref 3.4–10.8)

## 2018-12-01 LAB — CERVICOVAGINAL ANCILLARY ONLY: Bacterial vaginitis: POSITIVE — AB

## 2018-12-01 LAB — HEMOGLOBINOPATHY EVALUATION
HGB C: 0 %
HGB S: 0 %
HGB VARIANT: 0 %
Hemoglobin A2 Quantitation: 2.2 % (ref 1.8–3.2)
Hemoglobin F Quantitation: 0 % (ref 0.0–2.0)
Hgb A: 97.8 % (ref 96.4–98.8)

## 2018-12-02 LAB — CYTOLOGY - PAP
Chlamydia: NEGATIVE
Neisseria Gonorrhea: NEGATIVE

## 2018-12-10 ENCOUNTER — Other Ambulatory Visit: Payer: Self-pay | Admitting: Maternal Newborn

## 2018-12-10 DIAGNOSIS — B9689 Other specified bacterial agents as the cause of diseases classified elsewhere: Secondary | ICD-10-CM

## 2018-12-10 DIAGNOSIS — N760 Acute vaginitis: Principal | ICD-10-CM

## 2018-12-10 MED ORDER — METRONIDAZOLE 500 MG PO TABS: 500.0000 mg | ORAL_TABLET | Freq: Two times a day (BID) | ORAL | 0 refills | Status: AC

## 2018-12-10 NOTE — Progress Notes (Signed)
Called with results of labs. She will call to schedule colposcopy for LGSIL Pap results. Rx sent for Flagyl to treat BV. Patient informed me that she had a miscarriage on the weekend. Encouraged follow up as needed for support or any complications.

## 2018-12-19 ENCOUNTER — Encounter: Payer: Self-pay | Admitting: Obstetrics & Gynecology

## 2019-03-11 ENCOUNTER — Encounter (HOSPITAL_COMMUNITY): Payer: Self-pay

## 2019-04-14 ENCOUNTER — Other Ambulatory Visit: Payer: Self-pay

## 2019-08-28 ENCOUNTER — Ambulatory Visit: Payer: Medicaid Other | Attending: Internal Medicine

## 2019-08-28 DIAGNOSIS — Z20822 Contact with and (suspected) exposure to covid-19: Secondary | ICD-10-CM

## 2019-08-29 LAB — SPECIMEN STATUS REPORT

## 2019-08-29 LAB — NOVEL CORONAVIRUS, NAA: SARS-CoV-2, NAA: NOT DETECTED

## 2019-10-09 ENCOUNTER — Emergency Department
Admission: EM | Admit: 2019-10-09 | Discharge: 2019-10-09 | Disposition: A | Discharge status: Home / Self Care | Payer: BLUE CROSS/BLUE SHIELD | Attending: Emergency Medicine | Admitting: Emergency Medicine

## 2019-10-09 ENCOUNTER — Other Ambulatory Visit: Payer: Self-pay

## 2019-10-09 ENCOUNTER — Encounter: Payer: Self-pay | Admitting: Emergency Medicine

## 2019-10-09 ENCOUNTER — Emergency Department: Payer: BLUE CROSS/BLUE SHIELD

## 2019-10-09 DIAGNOSIS — O2631 Retained intrauterine contraceptive device in pregnancy, first trimester: Secondary | ICD-10-CM

## 2019-10-09 DIAGNOSIS — R110 Nausea: Secondary | ICD-10-CM | POA: Insufficient documentation

## 2019-10-09 DIAGNOSIS — Z3A01 Less than 8 weeks gestation of pregnancy: Secondary | ICD-10-CM | POA: Diagnosis not present

## 2019-10-09 DIAGNOSIS — Z3491 Encounter for supervision of normal pregnancy, unspecified, first trimester: Secondary | ICD-10-CM

## 2019-10-09 DIAGNOSIS — O210 Mild hyperemesis gravidarum: Secondary | ICD-10-CM

## 2019-10-09 DIAGNOSIS — R5383 Other fatigue: Secondary | ICD-10-CM | POA: Insufficient documentation

## 2019-10-09 DIAGNOSIS — O99891 Other specified diseases and conditions complicating pregnancy: Secondary | ICD-10-CM | POA: Insufficient documentation

## 2019-10-09 DIAGNOSIS — O219 Vomiting of pregnancy, unspecified: Secondary | ICD-10-CM

## 2019-10-09 LAB — BASIC METABOLIC PANEL
Anion gap: 7 (ref 5–15)
BUN: 10 mg/dL (ref 6–20)
CO2: 19 mmol/L — ABNORMAL LOW (ref 22–32)
Calcium: 9.2 mg/dL (ref 8.9–10.3)
Chloride: 105 mmol/L (ref 98–111)
Creatinine, Ser: 0.76 mg/dL (ref 0.44–1.00)
GFR calc Af Amer: >60 mL/min (ref >60)
GFR calc non Af Amer: >60 mL/min (ref >60)
Glucose, Bld: 99 mg/dL (ref 70–99)
Potassium: 3.6 mmol/L (ref 3.5–5.1)
Sodium: 131 mmol/L — ABNORMAL LOW (ref 135–145)

## 2019-10-09 LAB — URINALYSIS, COMPLETE (UACMP) WITH MICROSCOPIC
Bacteria, UA: NONE SEEN
Bilirubin Urine: NEGATIVE
Glucose, UA: NEGATIVE mg/dL
Hgb urine dipstick: NEGATIVE
Ketones, ur: NEGATIVE mg/dL
Leukocytes,Ua: NEGATIVE
Nitrite: NEGATIVE
Protein, ur: NEGATIVE mg/dL
Specific Gravity, Urine: 1.025 (ref 1.005–1.030)
pH: 6 (ref 5.0–8.0)

## 2019-10-09 LAB — TROPONIN I (HIGH SENSITIVITY)
Troponin I (High Sensitivity): <2 ng/L (ref <18)
Troponin I (High Sensitivity): <2 ng/L (ref <18)

## 2019-10-09 LAB — CBC WITH DIFFERENTIAL/PLATELET
Abs Immature Granulocytes: 0.03 10*3/uL (ref 0.00–0.07)
Basophils Absolute: 0 10*3/uL (ref 0.0–0.1)
Basophils Relative: 0 %
Eosinophils Absolute: 0.1 10*3/uL (ref 0.0–0.5)
Eosinophils Relative: 1 %
HCT: 36.2 % (ref 36.0–46.0)
Hemoglobin: 11.8 g/dL — ABNORMAL LOW (ref 12.0–15.0)
Immature Granulocytes: 1 %
Lymphocytes Relative: 36 %
Lymphs Abs: 2.3 10*3/uL (ref 0.7–4.0)
MCH: 28 pg (ref 26.0–34.0)
MCHC: 32.6 g/dL (ref 30.0–36.0)
MCV: 85.8 fL (ref 80.0–100.0)
Monocytes Absolute: 0.7 10*3/uL (ref 0.1–1.0)
Monocytes Relative: 11 %
Neutro Abs: 3.2 10*3/uL (ref 1.7–7.7)
Neutrophils Relative %: 51 %
Platelets: 194 10*3/uL (ref 150–400)
RBC: 4.22 MIL/uL (ref 3.87–5.11)
RDW: 13.4 % (ref 11.5–15.5)
WBC: 6.3 10*3/uL (ref 4.0–10.5)
nRBC: 0 % (ref 0.0–0.2)

## 2019-10-09 LAB — POCT PREGNANCY, URINE: Preg Test, Ur: POSITIVE — AB

## 2019-10-09 LAB — LIPASE, BLOOD: Lipase: 34 U/L (ref 11–51)

## 2019-10-09 MED ORDER — DOXYLAMINE-PYRIDOXINE 10-10 MG PO TBEC: 2.0000 | DELAYED_RELEASE_TABLET | Freq: Every day | ORAL | 0 refills | Status: AC

## 2019-10-09 NOTE — ED Triage Notes (Signed)
Pt arrives ambulatory to triage with c/o SOB and nausea x 6 days. Pt is in NAD.

## 2019-10-09 NOTE — ED Provider Notes (Signed)
[facility] Emergency Department Provider Note   ____________________________________________   First MD Initiated Contact with Patient 10/09/19 2204     (approximate)  I have reviewed the triage vital signs and the nursing notes.   HISTORY  Chief Complaint Shortness of Breath    HPI Shelly Shah is a 29 y.o. female here for evaluation of nausea and feeling easily fatigued   Patient reports to me that she thinks she could be pregnant, she feels like her symptoms of nausea and feeling easily fatigued may be related however she reports she took 2 pregnancy test recently that were both negative but did have intercourse and took a Plan B tablet in January.  She reports her last menstrual cycle occurred at the end of January but does not know the exact date.  No fevers or chills.  No Covid exposure.  No chest pain or shortness of breath.  She describes that she does not really feel short of breath she just feels more like fatigue when she does things than normal, but does not really feel like she is having trouble breathing per se  She does have mild nausea without vomiting.  No vaginal bleeding or discharge no abdominal pain.  2 previous pregnancies that she reports went well  Past Medical History:  Diagnosis Date  . ADD (attention deficit disorder)   . BMI 34.0-34.9,adult   . Insufficient prenatal care    late entry to care (15wks 3/7)  . UTI (urinary tract infection) during pregnancy 08/30/2016    Patient Active Problem List   Diagnosis Date Noted  . BMI 34.0-34.9,adult 11/13/2016  . Acute depression 04/07/2015  . 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 CESAREAN SECTION N/A 02/16/2017   Procedure: CESAREAN SECTION;  Surgeon: Vena Austria, MD;  Location: [facility] ORS;  Service: Obstetrics;  Laterality: N/A;    Prior to Admission medications   Medication Sig Start Date End  Date Taking? Authorizing Provider  Doxylamine-Pyridoxine (DICLEGIS) 10-10 MG TBEC Take 2 tablets by mouth at bedtime. If symptoms persist, add one tablet in the morning and one in the afternoon 10/09/19   Sharyn Creamer, MD  VYVANSE 50 MG CHEW  10/29/18   [provider]    Allergies Patient has no known allergies.  No family history on file.  Social History Social History   Tobacco Use  . Smoking status: Never Smoker  . Smokeless tobacco: Never Used  Substance Use Topics  . Alcohol use: No  . Drug use: No    Review of Systems Constitutional: No fever/chills but feeling fatigued, feels more fatigued than usual especially with doing activities like walking or being active Eyes: No visual changes. ENT: No sore throat. Cardiovascular: Denies chest pain. Respiratory: Denies shortness of breath.  See HPI Gastrointestinal: No abdominal pain.  Some nausea. Genitourinary: Negative for dysuria. Musculoskeletal: Negative for back pain. Skin: Negative for rash. Neurological: Negative for headaches, areas of focal weakness or numbness.    ____________________________________________   PHYSICAL EXAM:  VITAL SIGNS: ED Triage Vitals  Enc Vitals Group     BP 10/09/19 1838 124/67     Pulse Rate 10/09/19 1838 89     Resp 10/09/19 1838 18     Temp 10/09/19 1838 98.7 F (37.1 C)     Temp Source 10/09/19 1838 Oral     SpO2 10/09/19 1838 100 %     Weight 10/09/19 1911 180 lb (81.6  kg)     Height 10/09/19 1911 5\' 1"  (1.549 m)     Head Circumference --      Peak Flow --      Pain Score 10/09/19 1911 0     Pain Loc --      Pain Edu? --      Excl. in [place]? --     Constitutional: Alert and oriented. Well appearing and in no acute distress.  She is very pleasant Eyes: Conjunctivae are normal. Head: Atraumatic. Nose: No congestion/rhinnorhea. Mouth/Throat: Mucous membranes are moist. Neck: No stridor.  Cardiovascular: Normal rate, regular rhythm. Grossly normal heart sounds.   Good peripheral circulation. Respiratory: Normal respiratory effort.  No retractions. Lungs CTAB. Gastrointestinal: Soft and nontender. No distention. Musculoskeletal: No lower extremity tenderness nor edema. Neurologic:  Normal speech and language. No gross focal neurologic deficits are appreciated.  Skin:  Skin is warm, dry and intact. No rash noted. Psychiatric: Mood and affect are normal. Speech and behavior are normal.  ____________________________________________   LABS (all labs ordered are listed, but only abnormal results are displayed)  Labs Reviewed  CBC WITH DIFFERENTIAL/PLATELET - Abnormal; Notable for the following components:      Result Value   Hemoglobin 11.8 (*)    All other components within normal limits  BASIC METABOLIC PANEL - Abnormal; Notable for the following components:   Sodium 131 (*)    CO2 19 (*)    All other components within normal limits  URINALYSIS, COMPLETE (UACMP) WITH MICROSCOPIC - Abnormal; Notable for the following components:   Color, Urine YELLOW (*)    APPearance CLEAR (*)    All other components within normal limits  POCT PREGNANCY, URINE - Abnormal; Notable for the following components:   Preg Test, Ur POSITIVE (*)    All other components within normal limits  LIPASE, BLOOD  TROPONIN I (HIGH SENSITIVITY)  TROPONIN I (HIGH SENSITIVITY)   ____________________________________________  EKG  ED ECG REPORT I, Delman Kitten, the attending physician, personally viewed and interpreted this ECG.  Date: 10/10/2019 EKG Time: 1930 Rate: 85 Rhythm: normal sinus rhythm QRS Axis: normal Intervals: normal ST/T Wave abnormalities: normal Narrative Interpretation: no evidence of acute ischemia  ____________________________________________  RADIOLOGY  DG Chest 2 View  Result Date: 10/09/2019 CLINICAL DATA:  Shortness of breath. Nausea. EXAM: CHEST - 2 VIEW COMPARISON:  Radiograph 09/24/2008 FINDINGS: The cardiomediastinal contours are normal.  Mild bronchial thickening. Pulmonary vasculature is normal. No consolidation, pleural effusion, or pneumothorax. No acute osseous abnormalities are seen. IMPRESSION: Mild bronchial thickening, may reflect bronchitis or asthma. Electronically Signed   By: Keith Rake M.D.   On: 10/09/2019 19:44    Chest x-ray reviewed some mild bronchial thickening ____________________________________________   PROCEDURES  Procedure(s) performed: None  Procedures  Critical Care performed: No  ____________________________________________   INITIAL IMPRESSION / ASSESSMENT AND PLAN / ED COURSE  Pertinent labs & imaging results that were available during my care of the patient were reviewed by me and considered in my medical decision making (see chart for details).   Patient presents for nausea and fatigue, she does not really describe shortness of breath.  Very reassuring clinical examination lung sounds and vital signs.  She describes more as feeling fatigued and more easily winded, but denies any chest pain.  Very reassuring examination.  No signs or symptoms of ACS, PE anemia or current concerning acute cardiac pulmonary or vascular concerns.  No associated abdominal pain but having some mild nausea.  Patient suspects she  may be pregnant, and her pregnancy test here has returned positive.  Discussed with the patient, she will follow-up with [facility], will prescribe her Diclegis which we discussed use of and potential side effects including fatigue she is agreeable to trial.  I think her symptoms are likely attributable to pregnancy, she has no abdominal pain no vaginal bleeding no signs that would suggest an ectopic or concerning pregnancy at this point.  She is very pleasant in no acute distress.  Unclear as to exact dates, but I would guess based on the history she may be somewhere in the range of about [redacted] weeks pregnant.  Return precautions and treatment recommendations and follow-up  discussed with the patient who is agreeable with the plan.       ____________________________________________   FINAL CLINICAL IMPRESSION(S) / ED DIAGNOSES  Final diagnoses:  First trimester pregnancy  Morning sickness        Note:  This document was prepared using Dragon voice recognition software and may include unintentional dictation errors       Sharyn Creamer, MD 10/10/19 [phone]

## 2021-02-08 ENCOUNTER — Encounter: Payer: BC Managed Care – PPO | Attending: Certified Nurse Midwife | Admitting: Dietician

## 2021-02-08 ENCOUNTER — Encounter: Payer: Self-pay | Admitting: Dietician

## 2021-02-08 ENCOUNTER — Other Ambulatory Visit: Payer: Self-pay

## 2021-02-08 VITALS — Ht 61.0 in | Wt 202.3 lb

## 2021-02-08 DIAGNOSIS — E669 Obesity, unspecified: Secondary | ICD-10-CM | POA: Diagnosis not present

## 2021-02-08 DIAGNOSIS — Z6838 Body mass index (BMI) 38.0-38.9, adult: Secondary | ICD-10-CM

## 2021-02-08 NOTE — Patient Instructions (Signed)
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 Keep it up! Plan meals with more veggies/ fruits.  Continue regular exercise at gym and active time at home.  Plan to have something to eat every 3-5 hours during the day. OK to have a light meal/ nutrition drink including broth with added protein powder occasionally if needed.  Pay attention to physical hunger and fulness symptoms and adjust portions to honor physical cues. Call back in August to schedule a follow up visit if needed.

## 2021-02-08 NOTE — Progress Notes (Signed)
Medical Nutrition Therapy: Visit start time: 0945  end time: 1045  Assessment:  Diagnosis: obesity Past medical history: none significant Psychosocial issues/ stress concerns: high stress level per patient  Preferred learning method:  Auditory Hands-on   Current weight: 202.3lbs Height: 5'1"  BMI:  38.22 Medications, supplements: reconciled list in medical record  Progress and evaluation:  Patient reports starting new job with increased stress; less time for exercise and focus on healthy diet which led to weight increase. Her personal goal weight is 165lbs, was last at this weight in 2016.   She has recently been working to significantly decrease soda intake, and avoiding late night eating Tried some intermittent fasting several months ago, didn't work  Physical activity: walking, calisthenics 90 minutes, 5x a week, started last week  Dietary Intake:  Usual eating pattern includes 2 meals and 1-2 snacks per day. Dining out frequency: 3-5 meals per week.  Breakfast: crystal light or orange juice, usually no food Snack: none Lunch: sometimes chicken sandwich and fries from [employer] or [place] Snack: chips; kind bar; cereal dry Supper: spaghetti/ other pasta; sandwiches; veg with butter or other fat Snack: same as pm, before 8pm Beverages: water with crystal light flavoring, some soda with lunch or dinner (decreasing), occasional juice  Nutrition Care Education: Topics covered:  Basic nutrition: basic food groups, appropriate nutrient balance, appropriate meal and snack schedule, general nutrition guidelines    Weight control: importance of low sugar and low fat choices; portion control strategies including incorporating low carb vegetables to stretch portions of starches and meats; estimated energy needs for weight loss at 1400kcal, provided guidance for 45%CHO, 25% protein, and 30% fat; discussed role of exercise and strategies for maintaining regular activity after returning to  work; effects of stress on weight and eating behaviors   Nutritional Diagnosis:  [place]-3.3 Overweight/obesity As related to history of excess calories and inadequate physical activity, and stress.  As evidenced by patient report of healthy history with current BMI of 38.22, working on positive diet and lifestyle changes to promote weight loss.  Intervention:  Instruction and discussion as noted above. Patient has been making appropriate lifestyle changes; she is motivated to continue.  Established additional goals for change with direction from patient. No follow up scheduled at this time; patient will schedule later if needed.  Education Materials given:  Designer, industrial/product with food lists; sample meal pattern Sample menus Visit summary with goals/ instructions to be viewed in MyChart   Learner/ who was taught:  Patient   Level of understanding: Verbalizes/ demonstrates competency   Demonstrated degree of understanding via:   Teach back Learning barriers: None  Willingness to learn/ readiness for change: Eager, change in progress  Monitoring and Evaluation:  Dietary intake, exercise, and body weight      follow up: prn

## 2021-10-14 IMAGING — CR DG CHEST 2V
2 series · 2 of 2 positions shown · non-contrast
Comparison: Radiograph 09/24/2008

CLINICAL DATA: Shortness of breath. Nausea.

EXAM:
CHEST - 2 VIEW

[chest pa]
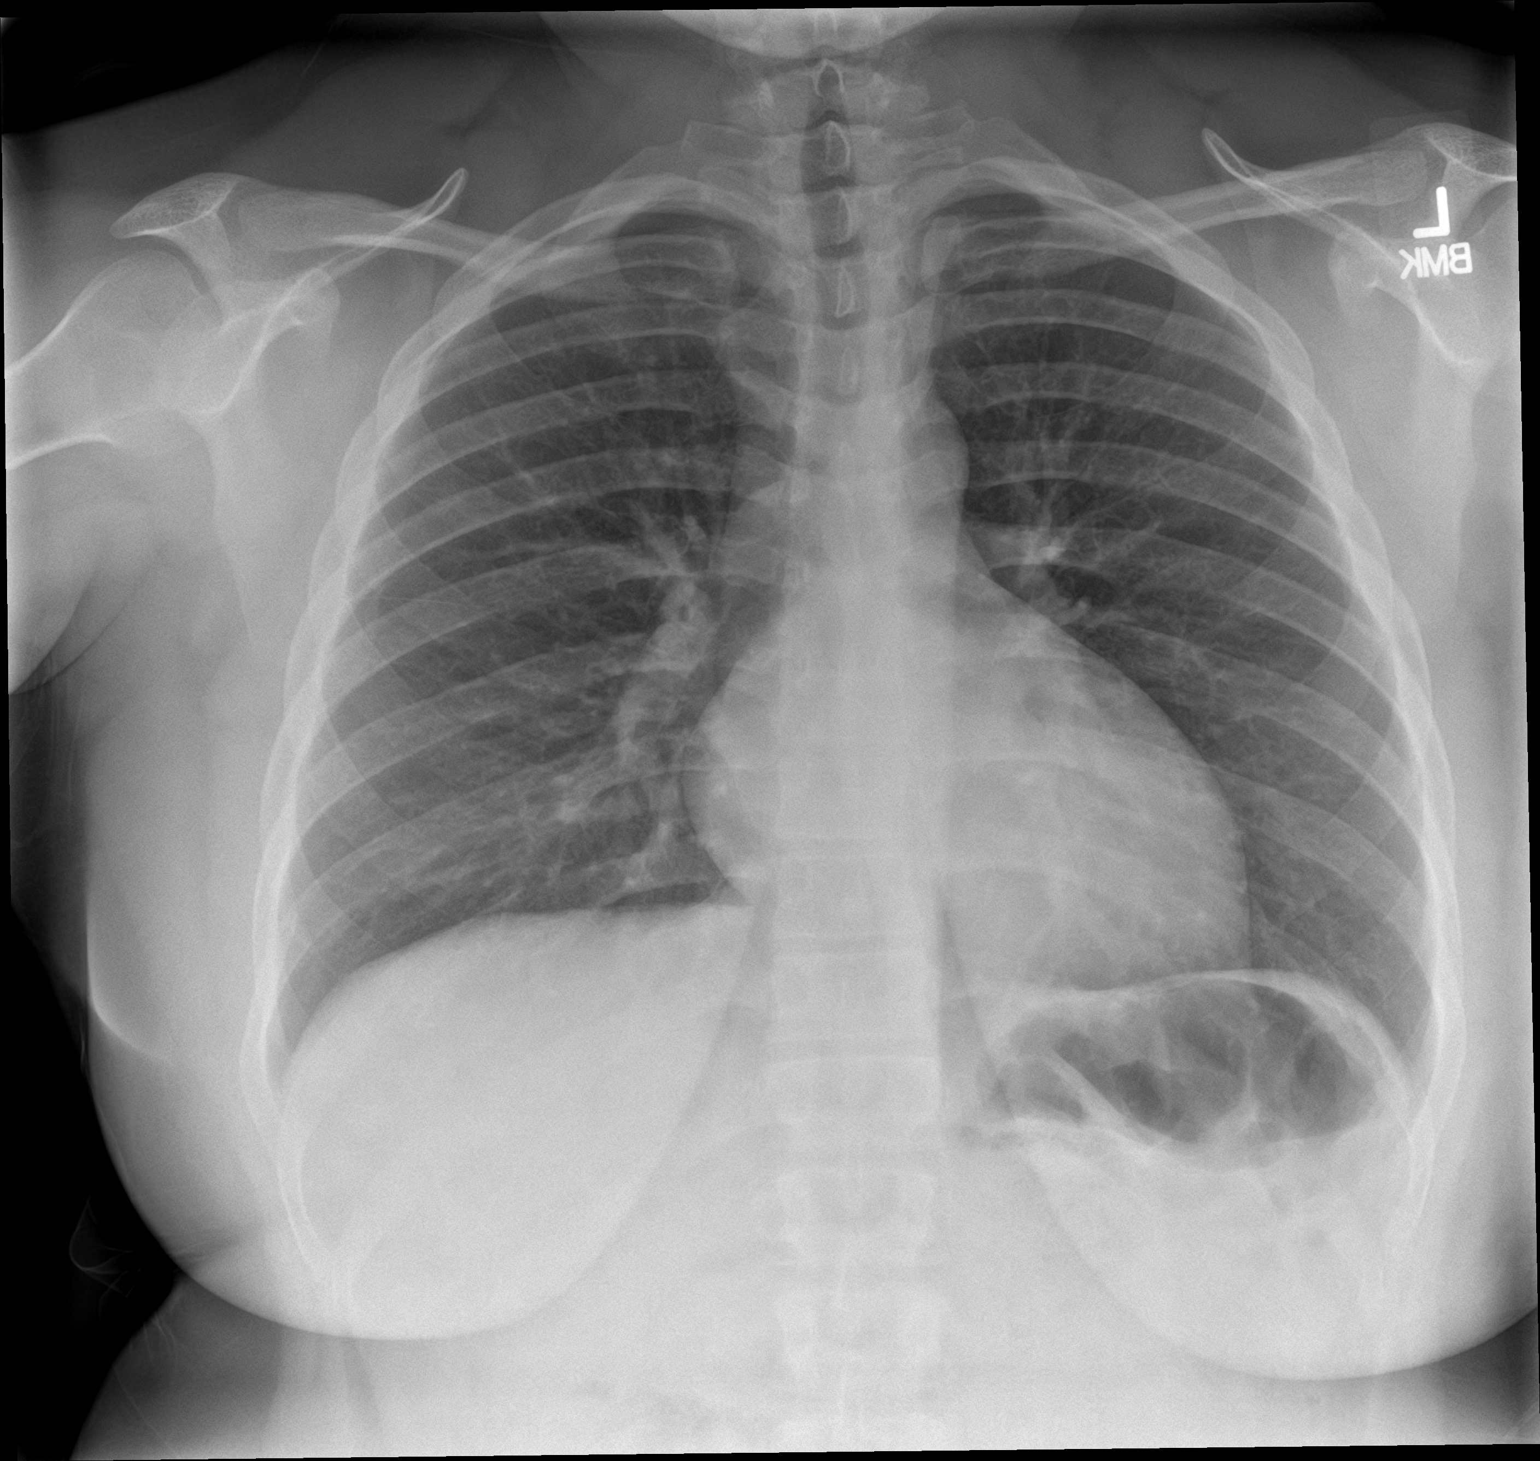

[chest lat]
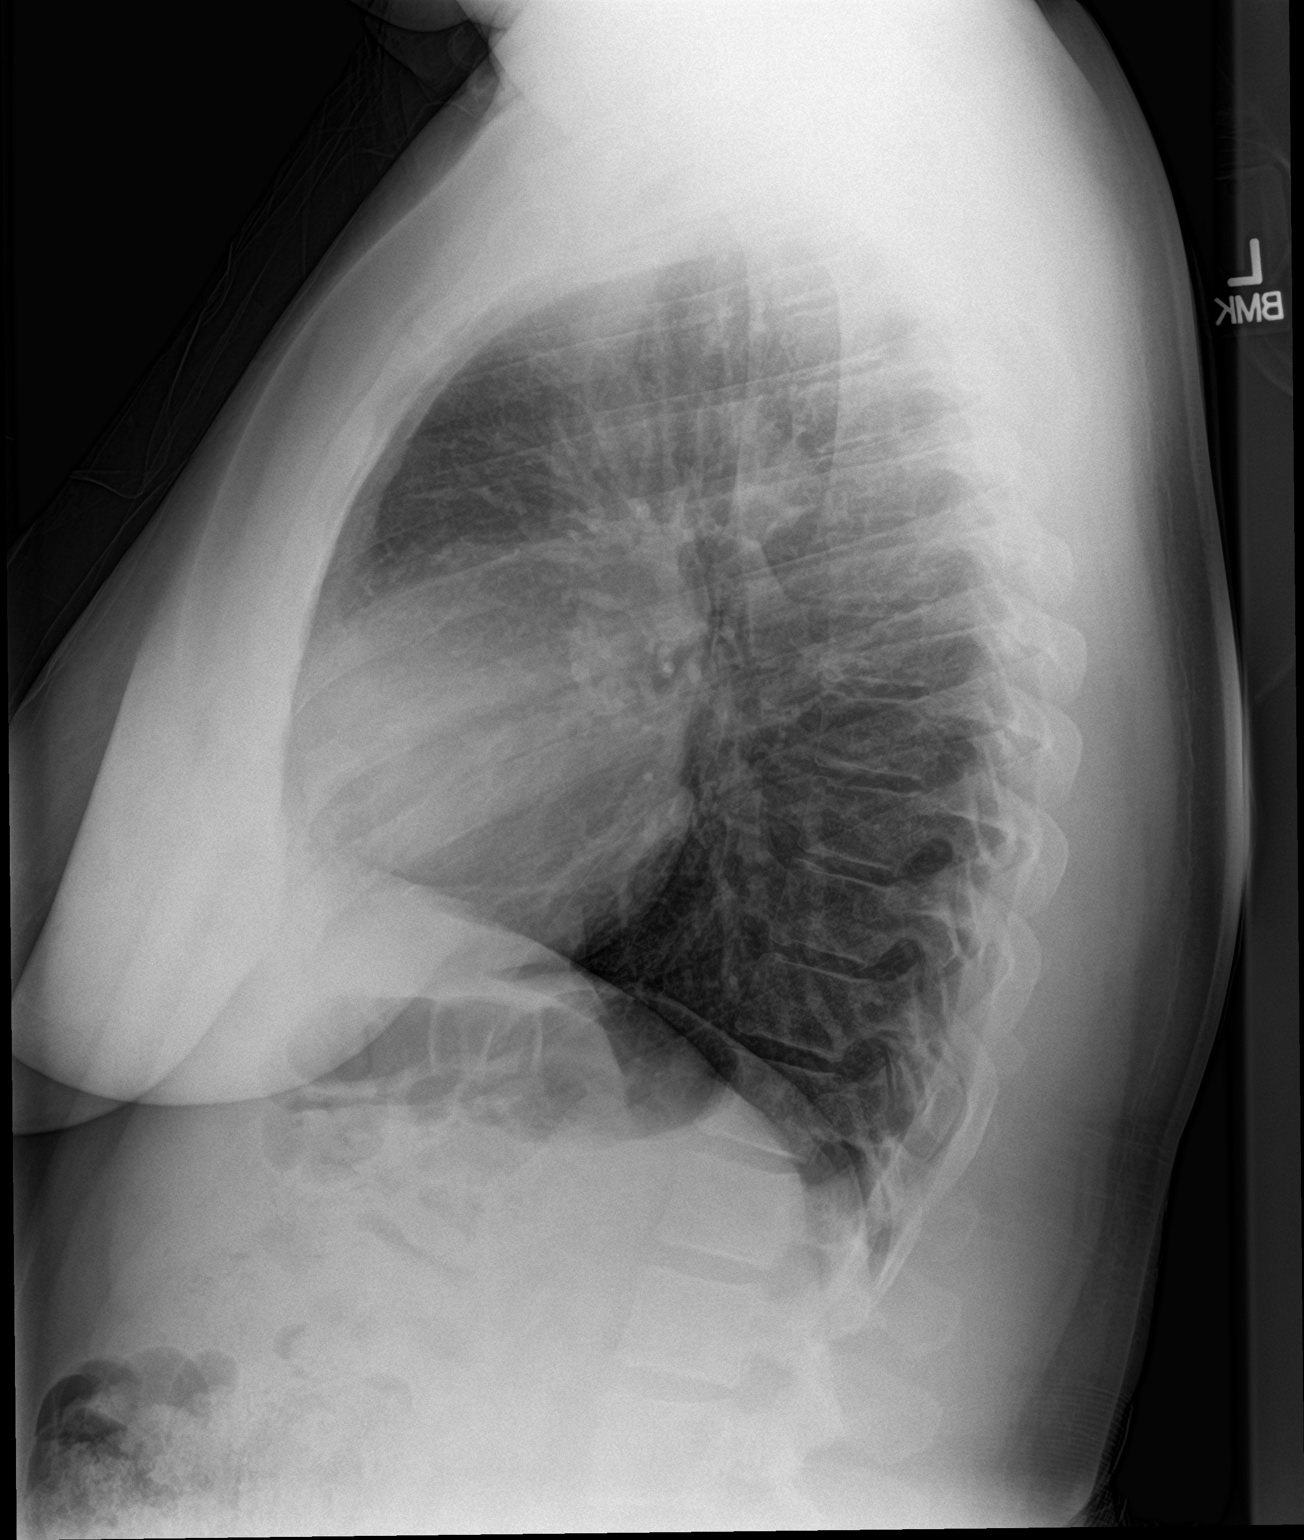

[2 of 2 positions shown; findings below may reference images not displayed]

FINDINGS: The cardiomediastinal contours are normal. Mild bronchial
thickening. Pulmonary vasculature is normal. No consolidation,
pleural effusion, or pneumothorax. No acute osseous abnormalities
are seen.
IMPRESSION: Mild bronchial thickening, may reflect bronchitis or asthma.
